# Patient Record
Sex: Male | Born: 1990
Health system: Southern US, Community
[De-identification: ages and names within clinical notes are randomized; demographics above are authoritative.]

## PROBLEM LIST (undated history)

## (undated) DIAGNOSIS — J45909 Unspecified asthma, uncomplicated: Secondary | ICD-10-CM

## (undated) HISTORY — PX: TONSILLECTOMY: SUR1361

---
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
## 2015-09-20 ENCOUNTER — Encounter (HOSPITAL_COMMUNITY): Payer: Self-pay | Admitting: Emergency Medicine

## 2015-09-20 ENCOUNTER — Emergency Department (HOSPITAL_COMMUNITY)
Admission: EM | Admit: 2015-09-20 | Discharge: 2015-09-21 | Disposition: A | Discharge status: Home / Self Care | Payer: BLUE CROSS/BLUE SHIELD | Attending: Emergency Medicine | Admitting: Emergency Medicine

## 2015-09-20 DIAGNOSIS — R05 Cough: Secondary | ICD-10-CM | POA: Insufficient documentation

## 2015-09-20 DIAGNOSIS — Z202 Contact with and (suspected) exposure to infections with a predominantly sexual mode of transmission: Secondary | ICD-10-CM | POA: Diagnosis present

## 2015-09-20 DIAGNOSIS — R197 Diarrhea, unspecified: Secondary | ICD-10-CM | POA: Diagnosis not present

## 2015-09-20 DIAGNOSIS — J029 Acute pharyngitis, unspecified: Secondary | ICD-10-CM | POA: Diagnosis not present

## 2015-09-20 DIAGNOSIS — Z880 Allergy status to penicillin: Secondary | ICD-10-CM | POA: Insufficient documentation

## 2015-09-20 DIAGNOSIS — J45909 Unspecified asthma, uncomplicated: Secondary | ICD-10-CM | POA: Diagnosis not present

## 2015-09-20 DIAGNOSIS — R499 Unspecified voice and resonance disorder: Secondary | ICD-10-CM | POA: Insufficient documentation

## 2015-09-20 DIAGNOSIS — R5383 Other fatigue: Secondary | ICD-10-CM | POA: Insufficient documentation

## 2015-09-20 DIAGNOSIS — B20 Human immunodeficiency virus [HIV] disease: Secondary | ICD-10-CM | POA: Diagnosis not present

## 2015-09-20 HISTORY — DX: Unspecified asthma, uncomplicated: J45.909

## 2015-09-20 NOTE — ED Notes (Signed)
Pt states that he had sexual contact with a person who had HIV in November and was tested but wanted to be retested. States he has felt 'run down' since that time. Alert and oriented.

## 2015-09-20 NOTE — ED Provider Notes (Signed)
History  By signing my name below, I, Karle PlumberJennifer Tensley, attest that this documentation has been prepared under the direction and in the presence of [employer] Maelyn Berrey, PA-C. Electronically Signed: Karle PlumberJennifer Tensley, ED Scribe. 09/21/2015. 12:02 AM.  Chief Complaint  Patient presents with  . Exposure to STD   The history is provided by the patient and medical records. No language interpreter was used.    HPI Comments:  Jesse Smith is a 25 y.o. male who presents to the Emergency Department complaining of an exposure to HIV a few months ago. He states he had unprotected intercourse with a male partner that later was diagnosed with HIV. Pt states he had a full STD panel done two months ago and received a negative result, but wants to follow up with the HIV testing. He reports intermittent cough, scratchy throat and hoarseness with some mild diarrhea. He also reports generally fatigued. He has not taken anything to treat his symptoms. He denies modifying factors. He denies fever, chills, nausea and vomiting.   Past Medical History  Diagnosis Date  . Asthma    Past Surgical History  Procedure Laterality Date  . Tonsillectomy     No family history on file. Social History  Substance Use Topics  . Smoking status: Never Smoker   . Smokeless tobacco: Not on file  . Alcohol Use: No    Review of Systems  Constitutional: Positive for fatigue. Negative for fever and chills.  HENT: Positive for sore throat and voice change.   Respiratory: Positive for cough.   Gastrointestinal: Negative for nausea and vomiting.  All other systems reviewed and are negative.   Allergies  Azithromycin; Ceclor; and Penicillins  Home Medications   Prior to Admission medications   Not on File   Triage Vitals: BP 117/67 mmHg  Pulse 77  Temp(Src) 97.7 F (36.5 C) (Oral)  Resp 16  Ht 5\' 11"  (1.803 m)  Wt 135 lb (61.236 kg)  BMI 18.84 kg/m2  SpO2 100%  Physical Exam  Constitutional: He is oriented to  person, place, and time. He appears well-developed and well-nourished. No distress.  Nontoxic/nonseptic appearing  HENT:  Head: Normocephalic and atraumatic.  Eyes: Conjunctivae and EOM are normal. No scleral icterus.  Neck: Normal range of motion.  No nuchal rigidity or meningismus  Cardiovascular: Normal rate, regular rhythm and intact distal pulses.   Pulmonary/Chest: Effort normal and breath sounds normal. No respiratory distress. He has no wheezes. He has no rales.  Lungs CTAB  Musculoskeletal: Normal range of motion.  Neurological: He is alert and oriented to person, place, and time. He exhibits normal muscle tone. Coordination normal.  Ambulatory with steady gait  Skin: Skin is warm and dry. No rash noted. He is not diaphoretic. No erythema. No pallor.  Psychiatric: He has a normal mood and affect. His behavior is normal.  Nursing note and vitals reviewed.   ED Course  Procedures (including critical care time) DIAGNOSTIC STUDIES: Oxygen Saturation is 100% on RA, normal by my interpretation.   COORDINATION OF CARE: 12:02 AM- Will order rapid HIV test. Pt verbalizes understanding and agrees to plan.  Medications - No data to display  Labs Review Labs Reviewed  RAPID HIV SCREEN (HIV 1/2 AB+AG)     MDM   Final diagnoses:  Other fatigue    25 year old male presents to the Emergency Department requesting an HIV test. He has a history of unprotected sexual intercourse with a partner tested positive for HIV. Patient initially had a negative  HIV test in November. He was told to have a second test completed. This was done in the emergency department and has been found to be negative. Patient stable for discharge. No indication for further emergent workup at this time.  I personally performed the services described in this documentation, which was scribed in my presence. The recorded information has been reviewed and is accurate.    Filed Vitals:   09/20/15 2341  BP: 117/67   Pulse: 77  Temp: 97.7 F (36.5 C)  TempSrc: Oral  Resp: 16  Height: 5\' 11"  (1.803 m)  Weight: 61.236 kg  SpO2: 100%      Antony Madura, PA-C 09/21/15 0117  Rolland Porter, MD 09/24/15 1229

## 2015-09-21 LAB — RAPID HIV SCREEN (HIV 1/2 AB+AG)
HIV 1/2 ANTIBODIES: NONREACTIVE
HIV-1 P24 ANTIGEN - HIV24: NONREACTIVE

## 2015-09-21 NOTE — Discharge Instructions (Signed)
Safe Sex  Safe sex is about reducing the risk of giving or getting a sexually transmitted disease (STD). STDs are spread through sexual contact involving the genitals, mouth, or rectum. Some STDs can be cured and others cannot. Safe sex can also prevent unintended pregnancies.   WHAT ARE SOME SAFE SEX PRACTICES?  · Limit your sexual activity to only one partner who is having sex with only you.  · Talk to your partner about his or her past partners, past STDs, and drug use.  · Use a condom every time you have sexual intercourse. This includes vaginal, oral, and anal sexual activity. Both females and males should wear condoms during oral sex. Only use latex or polyurethane condoms and water-based lubricants. Using petroleum-based lubricants or oils to lubricate a condom will weaken the condom and increase the chance that it will break. The condom should be in place from the beginning to the end of sexual activity. Wearing a condom reduces, but does not completely eliminate, your risk of getting or giving an STD. STDs can be spread by contact with infected body fluids and skin.  · Get vaccinated for hepatitis B and HPV.  · Avoid alcohol and recreational drugs, which can affect your judgment. You may forget to use a condom or participate in high-risk sex.  · For females, avoid douching after sexual intercourse. Douching can spread an infection farther into the reproductive tract.  · Check your body for signs of sores, blisters, rashes, or unusual discharge. See your health care provider if you notice any of these signs.  · Avoid sexual contact if you have symptoms of an infection or are being treated for an STD. If you or your partner has herpes, avoid sexual contact when blisters are present. Use condoms at all other times.  · If you are at risk of being infected with HIV, it is recommended that you take a prescription medicine daily to prevent HIV infection. This is called pre-exposure prophylaxis (PrEP). You are  considered at risk if:    You are a man who has sex with other men (MSM).    You are a heterosexual man or woman who is sexually active with more than one partner.    You take drugs by injection.    You are sexually active with a partner who has HIV.  · Talk with your health care provider about whether you are at high risk of being infected with HIV. If you choose to begin PrEP, you should first be tested for HIV. You should then be tested every 3 months for as long as you are taking PrEP.  · See your health care provider for regular screenings, exams, and tests for other STDs. Before having sex with a new partner, each of you should be screened for STDs and should talk about the results with each other.  WHAT ARE THE BENEFITS OF SAFE SEX?   · There is less chance of getting or giving an STD.  · You can prevent unwanted or unintended pregnancies.  · By discussing safe sex concerns with your partner, you may increase feelings of intimacy, comfort, trust, and honesty between the two of you.     This information is not intended to replace advice given to you by your health care provider. Make sure you discuss any questions you have with your health care provider.     Document Released: 10/11/2004 Document Revised: 09/24/2014 Document Reviewed: 02/25/2012  Elsevier Interactive Patient Education ©2016 Elsevier Inc.

## 2017-02-21 ENCOUNTER — Telehealth: Payer: Self-pay

## 2017-02-21 NOTE — Telephone Encounter (Signed)
Barbara CowerJason with [facility] called the office to see if we are able to schedule this patient.  He stated the patient told him we never called for an appointment.    Barbara CowerJason was informed the patient was called several times and he has no voice mail and has not returned our calls.    I attempted to reach patient today to schedule appointment.   Laurell Josephsammy K Kaiyah Eber, RN

## 2017-03-21 ENCOUNTER — Ambulatory Visit (INDEPENDENT_AMBULATORY_CARE_PROVIDER_SITE_OTHER): Payer: BLUE CROSS/BLUE SHIELD | Admitting: Licensed Clinical Social Worker

## 2017-03-21 ENCOUNTER — Other Ambulatory Visit (HOSPITAL_COMMUNITY)
Admission: RE | Admit: 2017-03-21 | Discharge: 2017-03-21 | Disposition: A | Discharge status: Home / Self Care | Payer: BLUE CROSS/BLUE SHIELD | Source: Ambulatory Visit | Attending: Internal Medicine | Admitting: Internal Medicine

## 2017-03-21 ENCOUNTER — Telehealth: Payer: Self-pay | Admitting: *Deleted

## 2017-03-21 ENCOUNTER — Ambulatory Visit (INDEPENDENT_AMBULATORY_CARE_PROVIDER_SITE_OTHER): Payer: BLUE CROSS/BLUE SHIELD | Admitting: Internal Medicine

## 2017-03-21 ENCOUNTER — Other Ambulatory Visit: Payer: Self-pay | Admitting: Pharmacist

## 2017-03-21 ENCOUNTER — Encounter: Payer: Self-pay | Admitting: Internal Medicine

## 2017-03-21 VITALS — BP 144/72 | Temp 97.6°F | Ht 72.0 in | Wt 142.0 lb

## 2017-03-21 DIAGNOSIS — B20 Human immunodeficiency virus [HIV] disease: Secondary | ICD-10-CM | POA: Insufficient documentation

## 2017-03-21 DIAGNOSIS — Z23 Encounter for immunization: Secondary | ICD-10-CM

## 2017-03-21 DIAGNOSIS — F4321 Adjustment disorder with depressed mood: Secondary | ICD-10-CM

## 2017-03-21 MED ORDER — BICTEGRAVIR-EMTRICITAB-TENOFOV 50-200-25 MG PO TABS: 1.0000 | ORAL_TABLET | Freq: Every day | ORAL | 5 refills | Status: DC

## 2017-03-21 MED FILL — BIKTARVY 50-200-25 MG TABS: 50-200-25 | 30 days supply | Qty: 30 | Fill #0

## 2017-03-21 NOTE — Telephone Encounter (Signed)
Attempted to call patient to inform him that Jesse Smith will meet with him on the day he sees pharmacy. No answer and no voice mail set up. [name]

## 2017-03-21 NOTE — Progress Notes (Signed)
Patient ID: Jesse Smith, male    DOB: Jan 11, 1991, 26 y.o.   MRN: [id number]  Reason for visit: to establish care as a new patient with HIV  HPI:   Patient was first diagnosed in about April by the health deparment for risk factor screening.   Previously tested negative in January 2018 during an ED visit.  No labs have yet been done otherwise.  He had some URI-like symptoms in April but no significant lymphadenopathy, no wieght loss, no diarrhea.  He is adjusting to the diagnosis and has no questions.  He is interested in treatment. No other concerns.  No history of GC, chlamydia or syphilis.    Past Medical History:  Diagnosis Date  . Asthma     Prior to Admission medications   Medication Sig Start Date End Date Taking? Authorizing Provider  bictegravir-emtricitabine-tenofovir AF (BIKTARVY) 50-200-25 MG TABS tablet Take 1 tablet by mouth daily. 03/21/17   Kuppelweiser, Cassie L, RPH    Allergies  Allergen Reactions  . Azithromycin Other (See Comments)    n/a  . Ceclor [Cefaclor] Other (See Comments)    n/a  . Penicillins Other (See Comments)    N/a childhood     Social History  Substance Use Topics  . Smoking status: Never Smoker  . Smokeless tobacco: Never Used  . Alcohol use Yes     Comment: occasional    FMH: he is not aware of any cardiac or renal disease in his family  Review of Systems Constitutional: negative for sweats, fatigue, malaise, anorexia and weight loss Respiratory: negative for cough or sputum Gastrointestinal: negative for nausea and diarrhea Integument/breast: negative for rash Hematologic/lymphatic: negative for lymphadenopathy All other systems reviewed and are negative    CONSTITUTIONAL:in no apparent distress and alert  Vitals:   03/21/17 0911  BP: (!) 144/72  Temp: 97.6 F (36.4 C)   EYES: anicteric HENT: no thrush CARD:Cor RRR RESP:CTA B; normal respiratory effort NW:GNFAOGI:Bowel sounds are normal, liver is not enlarged, spleen is not  enlarged MS:no pedal edema noted SKIN: no rashes NEURO: non-focal   Assessment: new patient here with HIV.  Discussed with patient treatment options and side effects, benefits of treatment, long term outcomes.  I discussed the severity of untreated HIV including higher cancer risk, opportunistic infections, renal failure.  Also discussed needing to use condoms, partner disclosure, necessary vaccines, blood monitoring.  All questions answered.    Plan: 1) labs today to include CD4, viral load, hepatitis A, B and C. 2) discussed treatment options and will start Biktarvy if no concerns on the labs 3) will follow up with PharmD in 2weeks to start Biktarvy and 2 months from now

## 2017-03-21 NOTE — Progress Notes (Unsigned)
HPI: Jesse Smith is a 26 y.o. male who presents to the [facility] as a new HIV patient to see Dr. Luciana Smith.   Allergies: Allergies  Allergen Reactions  . Azithromycin Other (See Comments)    n/a  . Ceclor [Cefaclor] Other (See Comments)    n/a  . Penicillins Other (See Comments)    N/a childhood     Past Medical History: Past Medical History:  Diagnosis Date  . Asthma     Social History: Social History   Social History  . Marital status: Single    Spouse name: N/A  . Number of children: N/A  . Years of education: N/A   Social History Main Topics  . Smoking status: Never Smoker  . Smokeless tobacco: Never Used  . Alcohol use Yes     Comment: occasional  . Drug use: No  . Sexual activity: Not Currently    Partners: Male   Other Topics Concern  . Not on file   Social History Narrative  . No narrative on file    Current Regimen: None  Labs: No results found for: HIV1RNAQUANT, HIV1RNAVL, CD4TABS, HEPBSAB, HEPBSAG, HCVAB  CrCl: CrCl cannot be calculated (Patient's most recent lab result is older than the maximum 21 days allowed.).  Lipids: No results found for: CHOL, TRIG, HDL, CHOLHDL, VLDL, LDLCALC  Assessment: Jesse Smith is here today to see Dr. Luciana Smith and initiate care for his newly diagnosed HIV infection.  He tells me he was diagnosed back in April and anxious to get on medications.  I discussed Biktarvy with him.  I educated him on the importance of taking the medication every day and not missing any doses.  He has [employer] [place], so [facility] were able [facility]. His co-pay was ~$1000 but Jesse Smith activated a co-pay card and now he has $0 co-pay. Dr. Luciana Smith will get labs today and he will f/u with me in ~1 month.   Plans: - Start Biktarvy PO daily - [facility] - F/u with me 8/13 at 9am  Jesse Smith, PharmD, CPP Infectious Diseases Clinical Pharmacist [facility] 03/21/2017, 10:11 AM

## 2017-03-21 NOTE — Progress Notes (Signed)
Integrated Behavioral Health Initial Visit  MRN: [id number] Name: Jesse Smith   Session Start time: 10:12 am Session End time: 10:15 am Total time: 3 mins  Type of Service: Integrated Behavioral Health- Individual/Family Interpretor:No. Interpretor Name and Language: N/A   Warm Hand Off Completed.       SUBJECTIVE: Jesse Smith is a 26 y.o. male accompanied by patient. Patient was referred by Dr. Luciana Axeomer for new diagnosis.  Patient reports the following symptoms/concerns: Patient was newly diagnosed as HIV positive in April.  Patient reported that at first he was depressed for one week, but then "snapped" out of it due to distraction from work.  Patient reported that he has a strong support system and that he has been talking to his friends.  Patient denied receiving current mental health treatment and denied needing mental health services at this time.  [facility] providing business card and stated that if he changes his mind he will call for referral or appointment.   Duration of problem: 2 months; Severity of problem: mild  OBJECTIVE: Mood: Euthymic and Affect: Appropriate Risk of harm to self or others: No plan to harm self or others  Thought process: coherent Thought content: logical   GOALS ADDRESSED: Patient will continue to adjust to new diagnosis, become educated about the disease and medications, and seek healthy adjustment to current life circumstances.  Patient will monitor self for depressive symptoms and seek help as needed.  INTERVENTIONS: Solution-Focused Strategies and Link to [facility]   ASSESSMENT: Patient currently experiencing adjustment issues due to new diagnosis and may benefit from community mental health.  PLAN: 1. Referral(s): [facility] ([facility]/Outside Clinic) 2. Patient was provided with a business card and [facility] if he changes his mind to receive a referral or appointment.  Vergia AlbertsSherry  Raynell Upton, [facility]

## 2017-03-22 LAB — URINALYSIS, ROUTINE W REFLEX MICROSCOPIC
Bilirubin Urine: NEGATIVE
Glucose, UA: NEGATIVE
HGB URINE DIPSTICK: NEGATIVE
Ketones, ur: NEGATIVE
NITRITE: NEGATIVE
PH: 6.5 (ref 5.0–8.0)
Protein, ur: NEGATIVE
Specific Gravity, Urine: 1.017 (ref 1.001–1.035)

## 2017-03-22 LAB — COMPLETE METABOLIC PANEL WITH GFR
ALBUMIN: 4.3 g/dL (ref 3.6–5.1)
ALK PHOS: 83 U/L (ref 40–115)
ALT: 18 U/L (ref 9–46)
AST: 15 U/L (ref 10–40)
BUN: 13 mg/dL (ref 7–25)
CALCIUM: 9.3 mg/dL (ref 8.6–10.3)
CO2: 25 mmol/L (ref 20–31)
CREATININE: 0.91 mg/dL (ref 0.60–1.35)
Chloride: 101 mmol/L (ref 98–110)
GFR, Est African American: >89 mL/min (ref >=60)
GFR, Est Non African American: >89 mL/min (ref >=60)
Glucose, Bld: 84 mg/dL (ref 65–99)
Potassium: 3.9 mmol/L (ref 3.5–5.3)
Sodium: 137 mmol/L (ref 135–146)
Total Bilirubin: 0.4 mg/dL (ref 0.2–1.2)
Total Protein: 8 g/dL (ref 6.1–8.1)

## 2017-03-22 LAB — HEPATITIS C ANTIBODY: HCV Ab: NEGATIVE

## 2017-03-22 LAB — URINALYSIS, MICROSCOPIC ONLY
Bacteria, UA: NONE SEEN [HPF]
CASTS: NONE SEEN [LPF]
Crystals: NONE SEEN [HPF]
RBC / HPF: NONE SEEN RBC/HPF (ref <=2)
YEAST: NONE SEEN [HPF]

## 2017-03-22 LAB — CBC WITH DIFFERENTIAL/PLATELET
Basophils Absolute: 57 cells/uL (ref 0–200)
Basophils Relative: 1 %
EOS PCT: 5 %
Eosinophils Absolute: 285 cells/uL (ref 15–500)
HCT: 43.7 % (ref 38.5–50.0)
Hemoglobin: 14.8 g/dL (ref 13.2–17.1)
LYMPHS PCT: 38 %
Lymphs Abs: 2166 cells/uL (ref 850–3900)
MCH: 30.1 pg (ref 27.0–33.0)
MCHC: 33.9 g/dL (ref 32.0–36.0)
MCV: 88.8 fL (ref 80.0–100.0)
MPV: 10.1 fL (ref 7.5–12.5)
Monocytes Absolute: 627 cells/uL (ref 200–950)
Monocytes Relative: 11 %
NEUTROS PCT: 45 %
Neutro Abs: 2565 cells/uL (ref 1500–7800)
Platelets: 210 10*3/uL (ref 140–400)
RBC: 4.92 MIL/uL (ref 4.20–5.80)
RDW: 14.5 % (ref 11.0–15.0)
WBC: 5.7 10*3/uL (ref 3.8–10.8)

## 2017-03-22 LAB — LIPID PANEL
CHOLESTEROL: 125 mg/dL (ref <200)
HDL: 21 mg/dL — ABNORMAL LOW (ref >40)
LDL CALC: 70 mg/dL (ref <100)
TRIGLYCERIDES: 170 mg/dL — AB (ref <150)
Total CHOL/HDL Ratio: 6 Ratio — ABNORMAL HIGH (ref <5.0)
VLDL: 34 mg/dL — ABNORMAL HIGH (ref <30)

## 2017-03-22 LAB — HEPATITIS B CORE ANTIBODY, TOTAL: Hep B Core Total Ab: REACTIVE — AB

## 2017-03-22 LAB — T-HELPER CELL (CD4) - (RCID CLINIC ONLY)
CD4 % Helper T Cell: 24 % — ABNORMAL LOW (ref 33–55)
CD4 T Cell Abs: 530 /uL (ref 400–2700)

## 2017-03-22 LAB — HEPATITIS B SURFACE ANTIBODY,QUALITATIVE: HEP B S AB: POSITIVE — AB

## 2017-03-22 LAB — URINE CYTOLOGY ANCILLARY ONLY
Chlamydia: NEGATIVE
NEISSERIA GONORRHEA: NEGATIVE

## 2017-03-22 LAB — HEPATITIS A ANTIBODY, TOTAL: Hep A Total Ab: NONREACTIVE

## 2017-03-22 LAB — HEPATITIS B SURFACE ANTIGEN: HEP B S AG: NEGATIVE

## 2017-03-22 LAB — RPR

## 2017-03-23 LAB — QUANTIFERON TB GOLD ASSAY (BLOOD)
Interferon Gamma Release Assay: NEGATIVE
MITOGEN-NIL SO: 7.87 [IU]/mL
QUANTIFERON NIL VALUE: 0.32 [IU]/mL
Quantiferon Tb Ag Minus Nil Value: <0 IU/mL

## 2017-03-26 LAB — HIV-1 RNA,QN PCR W/REFLEX GENOTYPE
HIV-1 RNA, QN PCR: 155000 {copies}/mL — AB
HIV-1 RNA, QN PCR: 5.19 {Log_copies}/mL — AB

## 2017-03-28 LAB — HLA B*5701: HLA-B 5701 W/RFLX HLA-B HIGH: POSITIVE — AB

## 2017-04-05 LAB — HIV-1 GENOTYPR PLUS

## 2017-04-19 ENCOUNTER — Encounter: Payer: Self-pay | Admitting: Licensed Clinical Social Worker

## 2017-04-19 MED FILL — BIKTARVY 50-200-25 MG TABS: 50-200-25 | 30 days supply | Qty: 30 | Fill #1

## 2017-04-29 ENCOUNTER — Ambulatory Visit (INDEPENDENT_AMBULATORY_CARE_PROVIDER_SITE_OTHER): Payer: BLUE CROSS/BLUE SHIELD | Admitting: Pharmacist Clinician (PhC)/ Clinical Pharmacy Specialist

## 2017-04-29 DIAGNOSIS — B20 Human immunodeficiency virus [HIV] disease: Secondary | ICD-10-CM

## 2017-04-29 DIAGNOSIS — Z23 Encounter for immunization: Secondary | ICD-10-CM

## 2017-04-29 LAB — BASIC METABOLIC PANEL
BUN: 11 mg/dL (ref 7–25)
CALCIUM: 9.2 mg/dL (ref 8.6–10.3)
CO2: 24 mmol/L (ref 20–32)
CREATININE: 1.06 mg/dL (ref 0.60–1.35)
Chloride: 103 mmol/L (ref 98–110)
GLUCOSE: 79 mg/dL (ref 65–99)
Potassium: 3.8 mmol/L (ref 3.5–5.3)
Sodium: 139 mmol/L (ref 135–146)

## 2017-04-29 NOTE — Patient Instructions (Signed)
Continue Biktavy 1 daily at about the same time We will do labs today

## 2017-04-29 NOTE — Progress Notes (Signed)
HPI: Jesse Smith is a 26 y.o. male who is here to follow up with pharmacy for labs and adherence.   Allergies: Allergies  Allergen Reactions  . Azithromycin Other (See Comments)    n/a  . Ceclor [Cefaclor] Other (See Comments)    n/a  . Penicillins Other (See Comments)    N/a childhood     Vitals:    Past Medical History: Past Medical History:  Diagnosis Date  . Asthma     Social History: Social History   Social History  . Marital status: Single    Spouse name: N/A  . Number of children: N/A  . Years of education: N/A   Social History Main Topics  . Smoking status: Never Smoker  . Smokeless tobacco: Never Used  . Alcohol use Yes     Comment: occasional  . Drug use: No  . Sexual activity: Not Currently    Partners: Male   Other Topics Concern  . Not on file   Social History Narrative  . No narrative on file    Previous Regimen:   Current Regimen: Biktarvy  Labs: CD4 T Cell Abs (/uL)  Date Value  03/21/2017 530   Hep B S Ab (no units)  Date Value  03/21/2017 POS (A)   Hepatitis B Surface Ag (no units)  Date Value  03/21/2017 NEGATIVE   HCV Ab (no units)  Date Value  03/21/2017 NEGATIVE    CrCl: CrCl cannot be calculated (Patient's most recent lab result is older than the maximum 21 days allowed.).  Lipids:    Component Value Date/Time   CHOL 125 03/21/2017 1746   TRIG 170 (H) 03/21/2017 1746   HDL 21 (L) 03/21/2017 1746   CHOLHDL 6.0 (H) 03/21/2017 1746   VLDL 34 (H) 03/21/2017 1746   LDLCALC 70 03/21/2017 1746    Assessment: Jesse Smith was recently started on his Biktarvy for HIV. He has tolerated very well without any side effects and has not missed any doses. This is the only med that he is currently on.Marland Kitchen. [facility]. He is hep A ab neg so we will start the series today. Scheduled him to f/u with Dr. Luciana Axeomer in Oct. He can get his second Menveo vaccine then. He is current working at both [employer] and [employer]. He  is going to school at [employer] soon for a business type of degree.   Encouraged him to sign up for MyChart so he can see his appts and lab results.   Recommendations:  HIV VL today Cont Biktarvy 1 daily F/u with Dr. Luciana Axeomer in Oct  Hep A#1 today Menveo #2 in Oct  Kunio Cummiskey, PharmD, BCPS, AAHIVP, CPP Clinical Infectious Disease Pharmacist [facility] 04/29/2017, 11:53 AM

## 2017-05-02 LAB — HIV-1 RNA QUANT-NO REFLEX-BLD
HIV 1 RNA QUANT: 44 {copies}/mL — AB
HIV-1 RNA Quant, Log: 1.64 Log copies/mL — ABNORMAL HIGH

## 2017-05-17 MED FILL — BIKTARVY 50-200-25 MG TABS: 50-200-25 | 30 days supply | Qty: 30 | Fill #2

## 2017-06-21 MED FILL — BIKTARVY 50-200-25 MG TABS: 50-200-25 | 30 days supply | Qty: 30 | Fill #3

## 2017-07-02 ENCOUNTER — Encounter: Payer: Self-pay | Admitting: Internal Medicine

## 2017-07-02 ENCOUNTER — Ambulatory Visit (INDEPENDENT_AMBULATORY_CARE_PROVIDER_SITE_OTHER): Payer: BLUE CROSS/BLUE SHIELD | Admitting: Internal Medicine

## 2017-07-02 VITALS — BP 125/76 | HR 60 | Temp 98.0°F | Ht 71.0 in | Wt 155.0 lb

## 2017-07-02 DIAGNOSIS — Z7185 Encounter for immunization safety counseling: Secondary | ICD-10-CM

## 2017-07-02 DIAGNOSIS — Z7189 Other specified counseling: Secondary | ICD-10-CM | POA: Diagnosis not present

## 2017-07-02 DIAGNOSIS — B20 Human immunodeficiency virus [HIV] disease: Secondary | ICD-10-CM

## 2017-07-02 DIAGNOSIS — G4700 Insomnia, unspecified: Secondary | ICD-10-CM

## 2017-07-03 DIAGNOSIS — G4700 Insomnia, unspecified: Secondary | ICD-10-CM | POA: Insufficient documentation

## 2017-07-03 DIAGNOSIS — Z7189 Other specified counseling: Secondary | ICD-10-CM | POA: Insufficient documentation

## 2017-07-03 DIAGNOSIS — Z7185 Encounter for immunization safety counseling: Secondary | ICD-10-CM | POA: Insufficient documentation

## 2017-07-03 LAB — T-HELPER CELL (CD4) - (RCID CLINIC ONLY)
CD4 % Helper T Cell: 26 % — ABNORMAL LOW (ref 33–55)
CD4 T CELL ABS: 630 /uL (ref 400–2700)

## 2017-07-03 NOTE — Progress Notes (Signed)
   Subjective:    Patient ID: Jesse SpeckingJamie C Smith, male    DOB: Nov 01, 1990, 26 y.o.   MRN: [id number]  HPI Here for follow up of HIV Started on Biktarvy and no issues.  Has not had any missed doses.  Working two jobs now and plans to start school in the Spring.  Takes Biktarvy at night but has difficulty sleeping.  No associated n/v/d.  No rashes.     Review of Systems  Constitutional: Positive for fatigue.  Skin: Negative for rash.  Neurological: Negative for dizziness.  Psychiatric/Behavioral: Positive for sleep disturbance.       Objective:   Physical Exam  Constitutional: He appears well-developed and well-nourished. No distress.  Eyes: No scleral icterus.  Cardiovascular: Normal rate, regular rhythm and normal heart sounds.   No murmur heard. Pulmonary/Chest: Effort normal. No respiratory distress.  Lymphadenopathy:    He has no cervical adenopathy.  Skin: No rash noted.   SH: no tobacco       Assessment & Plan:

## 2017-07-03 NOTE — Assessment & Plan Note (Signed)
He will try taking the medication in the am and see if his sleeping is improved.  Discussed sleep hygiiene otherwise

## 2017-07-03 NOTE — Assessment & Plan Note (Signed)
Doing well and if labs ok, rtc 3-4 months

## 2017-07-03 NOTE — Assessment & Plan Note (Signed)
Offered flu shot, refused.  Counseled on its importance

## 2017-07-05 LAB — HIV-1 RNA QUANT-NO REFLEX-BLD
HIV 1 RNA QUANT: 43 {copies}/mL — AB
HIV-1 RNA QUANT, LOG: 1.63 {Log_copies}/mL — AB

## 2017-07-23 MED FILL — BIKTARVY 50-200-25 MG TABS: 50-200-25 | 30 days supply | Qty: 30 | Fill #4

## 2017-08-21 MED FILL — BIKTARVY 50-200-25 MG TABS: 50-200-25 | 30 days supply | Qty: 30 | Fill #5

## 2017-09-04 ENCOUNTER — Other Ambulatory Visit: Payer: Self-pay | Admitting: Pharmacist Clinician (PhC)/ Clinical Pharmacy Specialist

## 2017-09-04 DIAGNOSIS — B20 Human immunodeficiency virus [HIV] disease: Secondary | ICD-10-CM

## 2017-09-04 MED ORDER — BICTEGRAVIR-EMTRICITAB-TENOFOV 50-200-25 MG PO TABS: 1.0000 | ORAL_TABLET | Freq: Every day | ORAL | 5 refills | Status: DC

## 2017-09-04 NOTE — Progress Notes (Signed)
Sending refills.

## 2017-09-20 MED FILL — BIKTARVY 50-200-25 MG TABS: 50-200-25 | 30 days supply | Qty: 30 | Fill #0

## 2017-10-01 ENCOUNTER — Ambulatory Visit (INDEPENDENT_AMBULATORY_CARE_PROVIDER_SITE_OTHER): Payer: BLUE CROSS/BLUE SHIELD | Admitting: Internal Medicine

## 2017-10-01 ENCOUNTER — Other Ambulatory Visit: Payer: Self-pay

## 2017-10-01 ENCOUNTER — Encounter: Payer: Self-pay | Admitting: Internal Medicine

## 2017-10-01 VITALS — BP 121/69 | HR 65 | Temp 98.2°F | Ht 71.0 in | Wt 164.0 lb

## 2017-10-01 DIAGNOSIS — Z7185 Encounter for immunization safety counseling: Secondary | ICD-10-CM

## 2017-10-01 DIAGNOSIS — Z7189 Other specified counseling: Secondary | ICD-10-CM

## 2017-10-01 DIAGNOSIS — B20 Human immunodeficiency virus [HIV] disease: Secondary | ICD-10-CM | POA: Diagnosis not present

## 2017-10-01 NOTE — Progress Notes (Signed)
   Subjective:    Patient ID: Jesse Smith, male    DOB: Jun 29, 1991, 27 y.o.   MRN: [id number]  HPI Here for follow up of HIV Started on Biktarvy and no issues.  Has not had any missed doses.  Takes Biktarvy at night now in the am.  Improved sleep.  No associated n/v/d.  No rashes.     Review of Systems  Constitutional: Positive for fatigue.       Fatigue from working a lot  Skin: Negative for rash.  Neurological: Negative for dizziness.  Psychiatric/Behavioral: Positive for sleep disturbance.       Improved       Objective:   Physical Exam  Constitutional: He appears well-developed and well-nourished. No distress.  Eyes: No scleral icterus.  Cardiovascular: Normal rate, regular rhythm and normal heart sounds.  No murmur heard. Pulmonary/Chest: Effort normal. No respiratory distress.  Lymphadenopathy:    He has no cervical adenopathy.  Skin: No rash noted.   SH: no tobacco       Assessment & Plan:

## 2017-10-01 NOTE — Assessment & Plan Note (Signed)
Counseled on the flu shot, refused.   

## 2017-10-01 NOTE — Assessment & Plan Note (Signed)
Doing well, continue biktarvy and rtc 6 months

## 2017-10-02 LAB — T-HELPER CELL (CD4) - (RCID CLINIC ONLY)
CD4 T CELL ABS: 610 /uL (ref 400–2700)
CD4 T CELL HELPER: 26 % — AB (ref 33–55)

## 2017-10-03 LAB — HIV-1 RNA QUANT-NO REFLEX-BLD
HIV 1 RNA QUANT: NOT DETECTED {copies}/mL
HIV-1 RNA QUANT, LOG: NOT DETECTED {Log_copies}/mL

## 2017-10-18 MED FILL — BIKTARVY 50-200-25 MG TABS: 50-200-25 | 30 days supply | Qty: 30 | Fill #1

## 2017-11-13 MED FILL — BIKTARVY 50-200-25 MG TABS: 50-200-25 | 30 days supply | Qty: 30 | Fill #2

## 2017-12-13 MED FILL — BIKTARVY 50-200-25 MG TABS: 50-200-25 | 30 days supply | Qty: 30 | Fill #3

## 2018-01-13 MED FILL — BIKTARVY 50-200-25 MG TABS: 50-200-25 | 30 days supply | Qty: 30 | Fill #4

## 2018-02-17 MED FILL — BIKTARVY 50-200-25 MG TABS: 50-200-25 | 30 days supply | Qty: 30 | Fill #5

## 2018-03-11 ENCOUNTER — Other Ambulatory Visit: Payer: Self-pay | Admitting: Internal Medicine

## 2018-03-11 DIAGNOSIS — B20 Human immunodeficiency virus [HIV] disease: Secondary | ICD-10-CM

## 2018-03-14 MED FILL — BIKTARVY 50-200-25 MG TABS: 50-200-25 | 30 days supply | Qty: 30 | Fill #0

## 2018-03-17 ENCOUNTER — Other Ambulatory Visit: Payer: BLUE CROSS/BLUE SHIELD

## 2018-03-17 ENCOUNTER — Other Ambulatory Visit: Payer: Self-pay | Admitting: *Deleted

## 2018-03-17 DIAGNOSIS — Z113 Encounter for screening for infections with a predominantly sexual mode of transmission: Secondary | ICD-10-CM

## 2018-03-17 DIAGNOSIS — B20 Human immunodeficiency virus [HIV] disease: Secondary | ICD-10-CM

## 2018-03-31 ENCOUNTER — Encounter: Payer: Self-pay | Admitting: Internal Medicine

## 2018-03-31 ENCOUNTER — Ambulatory Visit (INDEPENDENT_AMBULATORY_CARE_PROVIDER_SITE_OTHER): Payer: BLUE CROSS/BLUE SHIELD | Admitting: Internal Medicine

## 2018-03-31 VITALS — BP 118/72 | HR 61 | Temp 97.8°F | Ht 71.0 in | Wt 160.0 lb

## 2018-03-31 DIAGNOSIS — Z23 Encounter for immunization: Secondary | ICD-10-CM | POA: Insufficient documentation

## 2018-03-31 DIAGNOSIS — Z7189 Other specified counseling: Secondary | ICD-10-CM | POA: Insufficient documentation

## 2018-03-31 DIAGNOSIS — Z7185 Encounter for immunization safety counseling: Secondary | ICD-10-CM | POA: Insufficient documentation

## 2018-03-31 DIAGNOSIS — Z79899 Other long term (current) drug therapy: Secondary | ICD-10-CM

## 2018-03-31 DIAGNOSIS — Z113 Encounter for screening for infections with a predominantly sexual mode of transmission: Secondary | ICD-10-CM | POA: Insufficient documentation

## 2018-03-31 DIAGNOSIS — B20 Human immunodeficiency virus [HIV] disease: Secondary | ICD-10-CM

## 2018-03-31 NOTE — Progress Notes (Signed)
   Subjective:    Patient ID: Jesse SpeckingJamie C Smith, male    DOB: [birth year], 27 y.o.   MRN: [id number]  HPI Here for follow up of HIV Started on Biktarvy and no issues.  Has not had any missed doses.  No associated n/v/d.  No new issues. No rashes.     Review of Systems  Constitutional: Negative for fatigue.       Fatigue from working a lot  Skin: Negative for rash.  Neurological: Negative for dizziness.  Psychiatric/Behavioral: Negative for sleep disturbance.       Improved       Objective:   Physical Exam  Constitutional: He appears well-developed and well-nourished. No distress.  Eyes: No scleral icterus.  Cardiovascular: Normal rate, regular rhythm and normal heart sounds.  No murmur heard. Pulmonary/Chest: Effort normal. No respiratory distress.  Lymphadenopathy:    He has no cervical adenopathy.  Skin: No rash noted.   SH: no tobacco       Assessment & Plan:

## 2018-03-31 NOTE — Assessment & Plan Note (Signed)
Counseled on Prevnar and given today 

## 2018-03-31 NOTE — Assessment & Plan Note (Signed)
Will screen.  Reports good condom use.

## 2018-03-31 NOTE — Assessment & Plan Note (Signed)
Counseled on vaccine and series started today Will return in 2 months for #2 then #3 next visit.

## 2018-03-31 NOTE — Assessment & Plan Note (Signed)
Doing well on Biktarvy and no changes.  rtc 6 months.

## 2018-04-01 LAB — COMPLETE METABOLIC PANEL WITH GFR
AG Ratio: 1.6 (calc) (ref 1.0–2.5)
ALKALINE PHOSPHATASE (APISO): 70 U/L (ref 40–115)
ALT: 10 U/L (ref 9–46)
AST: 14 U/L (ref 10–40)
Albumin: 4.7 g/dL (ref 3.6–5.1)
BILIRUBIN TOTAL: 0.5 mg/dL (ref 0.2–1.2)
BUN: 12 mg/dL (ref 7–25)
CHLORIDE: 99 mmol/L (ref 98–110)
CO2: 29 mmol/L (ref 20–32)
CREATININE: 1.03 mg/dL (ref 0.60–1.35)
Calcium: 9.4 mg/dL (ref 8.6–10.3)
GFR, Est African American: 116 mL/min/{1.73_m2} (ref >=60)
GFR, Est Non African American: 100 mL/min/{1.73_m2} (ref >=60)
GLOBULIN: 3 g/dL (ref 1.9–3.7)
GLUCOSE: 91 mg/dL (ref 65–99)
Potassium: 3.8 mmol/L (ref 3.5–5.3)
SODIUM: 136 mmol/L (ref 135–146)
Total Protein: 7.7 g/dL (ref 6.1–8.1)

## 2018-04-01 LAB — CBC WITH DIFFERENTIAL/PLATELET
BASOS ABS: 20 {cells}/uL (ref 0–200)
Basophils Relative: 0.4 %
EOS ABS: 100 {cells}/uL (ref 15–500)
Eosinophils Relative: 2 %
HEMATOCRIT: 44.7 % (ref 38.5–50.0)
HEMOGLOBIN: 15.6 g/dL (ref 13.2–17.1)
LYMPHS ABS: 2000 {cells}/uL (ref 850–3900)
MCH: 32.1 pg (ref 27.0–33.0)
MCHC: 34.9 g/dL (ref 32.0–36.0)
MCV: 92 fL (ref 80.0–100.0)
MPV: 10.3 fL (ref 7.5–12.5)
Monocytes Relative: 10.5 %
NEUTROS ABS: 2355 {cells}/uL (ref 1500–7800)
Neutrophils Relative %: 47.1 %
Platelets: 237 10*3/uL (ref 140–400)
RBC: 4.86 10*6/uL (ref 4.20–5.80)
RDW: 12 % (ref 11.0–15.0)
Total Lymphocyte: 40 %
WBC: 5 10*3/uL (ref 3.8–10.8)
WBCMIX: 525 {cells}/uL (ref 200–950)

## 2018-04-01 LAB — LIPID PANEL
CHOL/HDL RATIO: 5.6 (calc) — AB (ref <5.0)
Cholesterol: 169 mg/dL (ref <200)
HDL: 30 mg/dL — AB (ref >40)
LDL CHOLESTEROL (CALC): 104 mg/dL — AB
NON-HDL CHOLESTEROL (CALC): 139 mg/dL — AB (ref <130)
Triglycerides: 231 mg/dL — ABNORMAL HIGH (ref <150)

## 2018-04-01 LAB — RPR: RPR Ser Ql: NONREACTIVE

## 2018-04-01 LAB — T-HELPER CELL (CD4) - (RCID CLINIC ONLY)
CD4 % Helper T Cell: 34 % (ref 33–55)
CD4 T CELL ABS: 660 /uL (ref 400–2700)

## 2018-04-02 LAB — HIV-1 RNA QUANT-NO REFLEX-BLD
HIV 1 RNA QUANT: DETECTED {copies}/mL — AB
HIV-1 RNA Quant, Log: <1.3 Log copies/mL — AB

## 2018-04-04 ENCOUNTER — Other Ambulatory Visit: Payer: Self-pay | Admitting: Internal Medicine

## 2018-04-04 DIAGNOSIS — B20 Human immunodeficiency virus [HIV] disease: Secondary | ICD-10-CM

## 2018-04-08 MED FILL — BIKTARVY 50-200-25 MG TABS: 50-200-25 | 30 days supply | Qty: 30 | Fill #0

## 2018-05-06 MED FILL — BIKTARVY 50-200-25 MG TABS: 50-200-25 | 30 days supply | Qty: 30 | Fill #1

## 2018-05-14 ENCOUNTER — Emergency Department (HOSPITAL_COMMUNITY)
Admission: EM | Admit: 2018-05-14 | Discharge: 2018-05-14 | Disposition: A | Discharge status: Home / Self Care | Payer: BLUE CROSS/BLUE SHIELD | Attending: Emergency Medicine | Admitting: Emergency Medicine

## 2018-05-14 ENCOUNTER — Emergency Department (HOSPITAL_COMMUNITY): Payer: BLUE CROSS/BLUE SHIELD

## 2018-05-14 ENCOUNTER — Encounter (HOSPITAL_COMMUNITY): Payer: Self-pay

## 2018-05-14 DIAGNOSIS — Z79899 Other long term (current) drug therapy: Secondary | ICD-10-CM | POA: Diagnosis not present

## 2018-05-14 DIAGNOSIS — S299XXA Unspecified injury of thorax, initial encounter: Secondary | ICD-10-CM | POA: Diagnosis present

## 2018-05-14 DIAGNOSIS — S4992XA Unspecified injury of left shoulder and upper arm, initial encounter: Secondary | ICD-10-CM | POA: Diagnosis not present

## 2018-05-14 DIAGNOSIS — Y92410 Unspecified street and highway as the place of occurrence of the external cause: Secondary | ICD-10-CM | POA: Insufficient documentation

## 2018-05-14 DIAGNOSIS — Y998 Other external cause status: Secondary | ICD-10-CM | POA: Insufficient documentation

## 2018-05-14 DIAGNOSIS — Y9389 Activity, other specified: Secondary | ICD-10-CM | POA: Diagnosis not present

## 2018-05-14 DIAGNOSIS — S29012A Strain of muscle and tendon of back wall of thorax, initial encounter: Secondary | ICD-10-CM | POA: Diagnosis not present

## 2018-05-14 DIAGNOSIS — J45909 Unspecified asthma, uncomplicated: Secondary | ICD-10-CM | POA: Diagnosis not present

## 2018-05-14 MED ORDER — CYCLOBENZAPRINE HCL 10 MG PO TABS: 10.0000 mg | ORAL_TABLET | Freq: Three times a day (TID) | ORAL | 0 refills | Status: DC | PRN

## 2018-05-14 MED ORDER — NAPROXEN 500 MG PO TABS: 500.0000 mg | ORAL_TABLET | Freq: Once | ORAL | Status: DC

## 2018-05-14 MED ORDER — CYCLOBENZAPRINE HCL 10 MG PO TABS
10.0000 mg | ORAL_TABLET | Freq: Once | ORAL | Status: AC
  Administered 2018-05-14: 10 mg via ORAL
  Filled 2018-05-14: qty 1

## 2018-05-14 MED FILL — CYCLOBENZAPRINE HCL 10 MG T: 10 | 7 days supply | Qty: 20 | Fill #0
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
## 2018-05-25 ENCOUNTER — Emergency Department (HOSPITAL_COMMUNITY)
Admission: EM | Admit: 2018-05-25 | Discharge: 2018-05-26 | Disposition: A | Discharge status: Home / Self Care | Payer: BLUE CROSS/BLUE SHIELD | Attending: Emergency Medicine | Admitting: Emergency Medicine

## 2018-05-25 ENCOUNTER — Encounter (HOSPITAL_COMMUNITY): Payer: Self-pay

## 2018-05-25 ENCOUNTER — Other Ambulatory Visit: Payer: Self-pay

## 2018-05-25 DIAGNOSIS — Z79899 Other long term (current) drug therapy: Secondary | ICD-10-CM | POA: Diagnosis not present

## 2018-05-25 DIAGNOSIS — B20 Human immunodeficiency virus [HIV] disease: Secondary | ICD-10-CM | POA: Insufficient documentation

## 2018-05-25 DIAGNOSIS — L02415 Cutaneous abscess of right lower limb: Secondary | ICD-10-CM | POA: Diagnosis not present

## 2018-05-25 NOTE — ED Triage Notes (Signed)
Pt reports an abscess to his R upper anterior thigh. He states that he tried to "pop" it and some purulent drainage came out, but it has gotten bigger, more painful and warm.

## 2018-05-26 MED ORDER — LIDOCAINE-EPINEPHRINE (PF) 2 %-1:200000 IJ SOLN
10.0000 mL | Freq: Once | INTRAMUSCULAR | Status: AC
  Administered 2018-05-26: 10 mL
  Filled 2018-05-26: qty 20

## 2018-05-26 MED ORDER — SULFAMETHOXAZOLE-TRIMETHOPRIM 800-160 MG PO TABS: 1.0000 | ORAL_TABLET | Freq: Two times a day (BID) | ORAL | 0 refills | Status: AC

## 2018-05-26 MED ORDER — SULFAMETHOXAZOLE-TRIMETHOPRIM 800-160 MG PO TABS
1.0000 | ORAL_TABLET | Freq: Once | ORAL | Status: AC
  Administered 2018-05-26: 1 via ORAL
  Filled 2018-05-26: qty 1

## 2018-05-26 MED ORDER — NAPROXEN 500 MG PO TABS
500.0000 mg | ORAL_TABLET | Freq: Once | ORAL | Status: AC
  Administered 2018-05-26: 500 mg via ORAL
  Filled 2018-05-26: qty 1

## 2018-05-26 NOTE — ED Provider Notes (Signed)
[facility]-EMERGENCY DEPT Provider Note   CSN: [id number] Arrival date & time: 05/25/18  2151     History   Chief Complaint Chief Complaint  Patient presents with  . Abscess    HPI Jesse Smith is a 27 y.o. male.  27 year old male with a history of asthma presents to the emergency department for evaluation of abscess to his right proximal thigh.  States that he noticed this 4 days ago.  Tried to apply warm compress on one occasion without improvement.  Also tried to "pop" the area.  Had small amount of purulent drainage, but no relief to pain or swelling.  States the area has gotten larger and more painful.  Is warm to touch.  No fevers.  The history is provided by the patient. No language interpreter was used.  Abscess    Past Medical History:  Diagnosis Date  . Asthma     Patient Active Problem List   Diagnosis Date Noted  . Screening examination for venereal disease 03/31/2018  . Encounter for long-term (current) use of high-risk medication 03/31/2018  . Need for prophylactic vaccination against Streptococcus pneumoniae (pneumococcus) 03/31/2018  . HPV vaccine counseling 03/31/2018  . Vaccine counseling 07/03/2017  . Insomnia 07/03/2017  . HIV disease (HCC) 03/21/2017    Past Surgical History:  Procedure Laterality Date  . TONSILLECTOMY          Home Medications    Prior to Admission medications   Medication Sig Start Date End Date Taking? Authorizing Provider  BIKTARVY 50-200-25 MG TABS tablet TAKE 1 TABLET BY MOUTH DAILY. 04/04/18   Comer, Belia Heman, MD  cyclobenzaprine (FLEXERIL) 10 MG tablet Take 1 tablet (10 mg total) by mouth 3 (three) times daily as needed for muscle spasms. 05/14/18   Molpus, John, MD  sulfamethoxazole-trimethoprim (BACTRIM DS,SEPTRA DS) 800-160 MG tablet Take 1 tablet by mouth 2 (two) times daily for 5 days. 05/26/18 05/31/18  Antony Madura, PA-C    Family History History reviewed. No pertinent family  history.  Social History Social History   Tobacco Use  . Smoking status: Never Smoker  . Smokeless tobacco: Never Used  Substance Use Topics  . Alcohol use: Yes    Comment: occasional  . Drug use: No     Allergies   Erythromycin; Azithromycin; Ceclor [cefaclor]; and Penicillins   Review of Systems Review of Systems Ten systems reviewed and are negative for acute change, except as noted in the HPI.    Physical Exam Updated Vital Signs BP 127/65   Pulse 65   Temp 98.5 F (36.9 C) (Oral)   Resp 18   SpO2 100%   Physical Exam  Constitutional: He is oriented to person, place, and time. He appears well-developed and well-nourished. No distress.  Nontoxic appearing and in NAD  HENT:  Head: Normocephalic and atraumatic.  Eyes: Conjunctivae and EOM are normal. No scleral icterus.  Neck: Normal range of motion.  Pulmonary/Chest: Effort normal. No respiratory distress.  Respirations even and unlabored  Musculoskeletal: Normal range of motion.       Right hip: He exhibits tenderness and swelling. He exhibits normal range of motion.       Legs: No decreased AROM or PROM of the R hip.  Neurological: He is alert and oriented to person, place, and time. He exhibits normal muscle tone. Coordination normal.  Skin: Skin is warm and dry. No rash noted. He is not diaphoretic. No erythema. No pallor.  Psychiatric: He has a normal  mood and affect. His behavior is normal.  Nursing note and vitals reviewed.    ED Treatments / Results  Labs (all labs ordered are listed, but only abnormal results are displayed) Labs Reviewed - No data to display  EKG None  Radiology No results found.  Procedures Procedures (including critical care time)   INCISION AND DRAINAGE Performed by: Antony Madura Consent: Verbal consent obtained. Risks and benefits: risks, benefits and alternatives were discussed Type: abscess  Body area: R thigh  Anesthesia: local infiltration  Incision was  made with a scalpel.  Local anesthetic: lidocaine 1% with epinephrine  Anesthetic total: 3 ml  Complexity: complex Blunt dissection to break up loculations  Drainage: purulent  Drainage amount: moderate  Packing material: 1/4 in iodoform gauze  Patient tolerance: Patient tolerated the procedure well with no immediate complications.     Medications Ordered in ED Medications  sulfamethoxazole-trimethoprim (BACTRIM DS,SEPTRA DS) 800-160 MG per tablet 1 tablet (has no administration in time range)  lidocaine-EPINEPHrine (XYLOCAINE W/EPI) 2 %-1:200000 (PF) injection 10 mL (10 mLs Other Given by Other 05/26/18 0057)  naproxen (NAPROSYN) tablet 500 mg (500 mg Oral Given 05/26/18 0053)     Initial Impression / Assessment and Plan / ED Course  I have reviewed the triage vital signs and the nursing notes.  Pertinent labs & imaging results that were available during my care of the patient were reviewed by me and considered in my medical decision making (see chart for details).     Patient with skin abscess amenable to incision and drainage.  Abscess packed with 1/4" iodoform; wound recheck in 2 days advised.  Encouraged home warm soaks and flushing.  Mild signs of cellulitis is surrounding skin.  Will d/c to home with Bactrim.  Return precautions discussed and provided. Patient discharged in stable condition with no unaddressed concerns.   Final Clinical Impressions(s) / ED Diagnoses   Final diagnoses:  Abscess of right thigh    ED Discharge Orders         Ordered    sulfamethoxazole-trimethoprim (BACTRIM DS,SEPTRA DS) 800-160 MG tablet  2 times daily     05/26/18 0242           Antony Madura, PA-C 05/26/18 0244    Devoria Albe, MD 05/26/18 [phone]

## 2018-05-26 NOTE — Discharge Instructions (Addendum)
Take Bactrim as prescribed until finished.  Continue with topical warm compresses 3-4 times per day to promote drainage.  Return for wound recheck in 48 hours as well as for packing removal.  You may take ibuprofen for management of any discomfort.  Return sooner for any new or concerning symptoms.

## 2018-05-27 ENCOUNTER — Other Ambulatory Visit: Payer: Self-pay

## 2018-05-27 ENCOUNTER — Encounter (HOSPITAL_COMMUNITY): Payer: Self-pay | Admitting: *Deleted

## 2018-05-27 DIAGNOSIS — B20 Human immunodeficiency virus [HIV] disease: Secondary | ICD-10-CM | POA: Insufficient documentation

## 2018-05-27 DIAGNOSIS — Z5189 Encounter for other specified aftercare: Secondary | ICD-10-CM | POA: Diagnosis not present

## 2018-05-27 DIAGNOSIS — J45909 Unspecified asthma, uncomplicated: Secondary | ICD-10-CM | POA: Insufficient documentation

## 2018-05-27 DIAGNOSIS — L0291 Cutaneous abscess, unspecified: Secondary | ICD-10-CM | POA: Diagnosis not present

## 2018-05-27 DIAGNOSIS — Z4800 Encounter for change or removal of nonsurgical wound dressing: Secondary | ICD-10-CM | POA: Diagnosis present

## 2018-05-27 NOTE — ED Triage Notes (Signed)
Pt stated "I was seen here for an abscess & told to come back in 48 hours for a recheck."

## 2018-05-28 ENCOUNTER — Emergency Department (HOSPITAL_COMMUNITY)
Admission: EM | Admit: 2018-05-28 | Discharge: 2018-05-28 | Disposition: A | Discharge status: Home / Self Care | Payer: BLUE CROSS/BLUE SHIELD | Attending: Emergency Medicine | Admitting: Emergency Medicine

## 2018-05-28 DIAGNOSIS — Z5189 Encounter for other specified aftercare: Secondary | ICD-10-CM

## 2018-05-28 NOTE — Discharge Instructions (Addendum)
Continue the antibiotics until gone. You will only need to return to the ED if symptoms worsen - if the area starts to grow in size, pain becomes worse, there is spreading redness or if you develop a fever.

## 2018-05-28 NOTE — ED Provider Notes (Signed)
[facility]-EMERGENCY DEPT Provider Note   CSN: [id number] Arrival date & time: 05/27/18  2339     History   Chief Complaint Chief Complaint  Patient presents with  . Wound Check    abscess to right lateral thigh    HPI Jesse Smith is a 27 y.o. male.  Patient here for wound check and packing removal from an abscess that was treated 2 days ago in the ED. He reports he feels the area is less red. There has been no fever. He has not changed the bandage since the I&D on 05/26/18. He reports he is taking the antibiotic as prescribed.   The history is provided by the patient. No language interpreter was used.  Wound Check     Past Medical History:  Diagnosis Date  . Asthma     Patient Active Problem List   Diagnosis Date Noted  . Screening examination for venereal disease 03/31/2018  . Encounter for long-term (current) use of high-risk medication 03/31/2018  . Need for prophylactic vaccination against Streptococcus pneumoniae (pneumococcus) 03/31/2018  . HPV vaccine counseling 03/31/2018  . Vaccine counseling 07/03/2017  . Insomnia 07/03/2017  . HIV disease (HCC) 03/21/2017    Past Surgical History:  Procedure Laterality Date  . TONSILLECTOMY          Home Medications    Prior to Admission medications   Medication Sig Start Date End Date Taking? Authorizing Provider  BIKTARVY 50-200-25 MG TABS tablet TAKE 1 TABLET BY MOUTH DAILY. 04/04/18   Comer, Belia Heman, MD  cyclobenzaprine (FLEXERIL) 10 MG tablet Take 1 tablet (10 mg total) by mouth 3 (three) times daily as needed for muscle spasms. 05/14/18   Molpus, John, MD  sulfamethoxazole-trimethoprim (BACTRIM DS,SEPTRA DS) 800-160 MG tablet Take 1 tablet by mouth 2 (two) times daily for 5 days. 05/26/18 05/31/18  Antony Madura, PA-C    Family History No family history on file.  Social History Social History   Tobacco Use  . Smoking status: Never Smoker  . Smokeless tobacco: Never Used  Substance  Use Topics  . Alcohol use: Yes    Comment: occasional  . Drug use: No     Allergies   Erythromycin; Azithromycin; Ceclor [cefaclor]; and Penicillins   Review of Systems Review of Systems  Constitutional: Negative for fever.  Gastrointestinal: Negative for nausea.  Skin: Positive for wound.  Neurological: Negative for numbness.     Physical Exam Updated Vital Signs BP (!) 123/55 (BP Location: Right Arm)   Pulse 81   Temp 98.4 F (36.9 C) (Oral)   Resp 16   Ht 5\' 11"  (1.803 m)   Wt 72.6 kg   SpO2 100%   BMI 22.32 kg/m   Physical Exam  Constitutional: He is oriented to person, place, and time. He appears well-developed and well-nourished. No distress.  Neck: Normal range of motion.  Pulmonary/Chest: Effort normal.  Musculoskeletal: Normal range of motion.  Neurological: He is alert and oriented to person, place, and time.  Skin: Skin is warm and dry.  peviously I&D'd abscess to proximal anterior right thigh with packing in place. Mild redness surround wound. No active drainage.   Psychiatric: He has a normal mood and affect.  Nursing note and vitals reviewed.    ED Treatments / Results  Labs (all labs ordered are listed, but only abnormal results are displayed) Labs Reviewed - No data to display  EKG None  Radiology No results found.  Procedures Procedures (including critical care  time)  Medications Ordered in ED Medications - No data to display   Initial Impression / Assessment and Plan / ED Course  I have reviewed the triage vital signs and the nursing notes.  Pertinent labs & imaging results that were available during my care of the patient were reviewed by me and considered in my medical decision making (see chart for details).     Patient here for 2 day recheck and packing removal from abscess treated by I&D on 05/26/18. No new complaints.   Packing removed without difficulty. Bandage replaced. Patient can be discharged home. Encouraged to  complete antibiotic regimen.   Final Clinical Impressions(s) / ED Diagnoses   Final diagnoses:  None   1. Abscess recheck  ED Discharge Orders    None       Elpidio Anis, PA-C 05/28/18 0136    Ward, Layla Maw, DO 05/28/18 [phone]

## 2018-05-28 NOTE — ED Notes (Signed)
Provider at bedside

## 2018-05-28 NOTE — ED Notes (Signed)
Using clean technique, applied a telfa dressing over wound and secured with surgical tape. Patient tolerated it well.

## 2018-06-02 ENCOUNTER — Ambulatory Visit: Payer: BLUE CROSS/BLUE SHIELD

## 2018-06-02 MED FILL — BIKTARVY 50-200-25 MG TABS: 50-200-25 | 30 days supply | Qty: 30 | Fill #2

## 2018-06-08 ENCOUNTER — Emergency Department (HOSPITAL_COMMUNITY)
Admission: EM | Admit: 2018-06-08 | Discharge: 2018-06-09 | Discharge status: Against Medical Advice | Payer: BLUE CROSS/BLUE SHIELD

## 2018-06-08 NOTE — ED Notes (Signed)
Pt called for triage no response from lobby 

## 2018-07-07 MED FILL — BIKTARVY 50-200-25 MG TABS: 50-200-25 | 30 days supply | Qty: 30 | Fill #3

## 2018-08-15 MED FILL — BIKTARVY 50-200-25 MG TABS: 50-200-25 | 30 days supply | Qty: 30 | Fill #4

## 2018-09-12 MED FILL — BIKTARVY 50-200-25 MG TABS: 50-200-25 | 30 days supply | Qty: 30 | Fill #5

## 2018-10-01 ENCOUNTER — Ambulatory Visit (INDEPENDENT_AMBULATORY_CARE_PROVIDER_SITE_OTHER): Payer: BLUE CROSS/BLUE SHIELD | Admitting: Internal Medicine

## 2018-10-01 ENCOUNTER — Encounter: Payer: Self-pay | Admitting: Internal Medicine

## 2018-10-01 VITALS — BP 124/75 | HR 69 | Temp 98.0°F | Wt 165.0 lb

## 2018-10-01 DIAGNOSIS — B20 Human immunodeficiency virus [HIV] disease: Secondary | ICD-10-CM

## 2018-10-01 DIAGNOSIS — Z7189 Other specified counseling: Secondary | ICD-10-CM | POA: Diagnosis not present

## 2018-10-01 DIAGNOSIS — Z23 Encounter for immunization: Secondary | ICD-10-CM | POA: Diagnosis not present

## 2018-10-01 DIAGNOSIS — Z7185 Encounter for immunization safety counseling: Secondary | ICD-10-CM

## 2018-10-01 NOTE — Addendum Note (Signed)
Addended by: Gerarda Fraction on: 10/01/2018 04:22 PM   Modules accepted: Orders

## 2018-10-01 NOTE — Assessment & Plan Note (Signed)
Doing well, labs today and rtc 5 months

## 2018-10-01 NOTE — Assessment & Plan Note (Signed)
Discussed and will do #2 today, #3 next visit.

## 2018-10-01 NOTE — Progress Notes (Signed)
   Subjective:    Patient ID: Jesse Smith, male    DOB: 05-Jan-1991, 28 y.o.   MRN: [id number]  HPI Here for follow up of HIV Continues on Biktarvy and no issues.  Has not had any missed doses.  No associated n/v/d.  No new issues. No rashes.  May be moving to [place] after his next visit.    Review of Systems  Constitutional: Negative for fatigue.  Skin: Negative for rash.  Neurological: Negative for dizziness.  Psychiatric/Behavioral: Negative for sleep disturbance.       Objective:   Physical Exam  Constitutional: He appears well-developed and well-nourished. No distress.  Eyes: No scleral icterus.  Cardiovascular: Normal rate, regular rhythm and normal heart sounds.  No murmur heard. Pulmonary/Chest: Effort normal. No respiratory distress.  Lymphadenopathy:    He has no cervical adenopathy.  Skin: No rash noted.   SH: no tobacco       Assessment & Plan:

## 2018-10-02 LAB — T-HELPER CELL (CD4) - (RCID CLINIC ONLY)
CD4 T CELL HELPER: 35 % (ref 33–55)
CD4 T Cell Abs: 570 /uL (ref 400–2700)

## 2018-10-03 LAB — HIV-1 RNA QUANT-NO REFLEX-BLD
HIV 1 RNA QUANT: NOT DETECTED {copies}/mL
HIV-1 RNA QUANT, LOG: NOT DETECTED {Log_copies}/mL

## 2018-10-06 ENCOUNTER — Other Ambulatory Visit: Payer: Self-pay | Admitting: Internal Medicine

## 2018-10-06 DIAGNOSIS — B20 Human immunodeficiency virus [HIV] disease: Secondary | ICD-10-CM

## 2018-10-10 MED FILL — BIKTARVY 50-200-25 MG TABS: 50-200-25 | 30 days supply | Qty: 30 | Fill #0

## 2018-11-07 MED FILL — BIKTARVY 50-200-25 MG TABS: 50-200-25 | 30 days supply | Qty: 30 | Fill #1

## 2018-12-05 MED FILL — BIKTARVY 50-200-25 MG TABS: 50-200-25 | 30 days supply | Qty: 30 | Fill #2

## 2019-01-01 MED FILL — BIKTARVY 50-200-25 MG TABS: 50-200-25 | 30 days supply | Qty: 30 | Fill #3

## 2019-02-03 MED FILL — BIKTARVY 50-200-25 MG TABS: 50-200-25 | 30 days supply | Qty: 30 | Fill #4

## 2019-02-10 ENCOUNTER — Ambulatory Visit: Payer: BLUE CROSS/BLUE SHIELD | Admitting: Internal Medicine

## 2019-03-04 MED FILL — BIKTARVY 50-200-25 MG TABS: 50-200-25 | 30 days supply | Qty: 30 | Fill #5

## 2019-03-30 ENCOUNTER — Other Ambulatory Visit: Payer: Self-pay | Admitting: Pharmacist

## 2019-03-30 DIAGNOSIS — B20 Human immunodeficiency virus [HIV] disease: Secondary | ICD-10-CM

## 2019-03-30 MED ORDER — BIKTARVY 50-200-25 MG PO TABS: 1.0000 | ORAL_TABLET | Freq: Every day | ORAL | 2 refills | Status: DC

## 2019-03-30 NOTE — Progress Notes (Signed)
Sending Biktarvy refills to [facility]. Patient does need a follow-up scheduled with Dr. Linus Salmons soon.

## 2019-04-06 MED FILL — BIKTARVY 50-200-25 MG TABS: 50-200-25 | 30 days supply | Qty: 30 | Fill #0

## 2019-05-12 MED FILL — BIKTARVY 50-200-25 MG TABS: 50-200-25 | 30 days supply | Qty: 30 | Fill #1

## 2019-06-10 MED FILL — BIKTARVY 50-200-25 MG TABS: 50-200-25 | 30 days supply | Qty: 30 | Fill #2

## 2019-07-03 ENCOUNTER — Other Ambulatory Visit: Payer: Self-pay | Admitting: Pharmacist

## 2019-07-03 DIAGNOSIS — B20 Human immunodeficiency virus [HIV] disease: Secondary | ICD-10-CM

## 2019-07-03 NOTE — Telephone Encounter (Signed)
Patient due for 6 month follow up, called without answer 07/03/2019 [facility]

## 2019-07-08 MED FILL — BIKTARVY 50-200-25 MG TABS: 50-200-25 | 30 days supply | Qty: 30 | Fill #0

## 2019-07-20 ENCOUNTER — Other Ambulatory Visit (HOSPITAL_COMMUNITY)
Admission: RE | Admit: 2019-07-20 | Discharge: 2019-07-20 | Disposition: A | Discharge status: Home / Self Care | Payer: BC Managed Care – PPO | Source: Ambulatory Visit | Attending: Internal Medicine | Admitting: Internal Medicine

## 2019-07-20 ENCOUNTER — Other Ambulatory Visit: Payer: BC Managed Care – PPO

## 2019-07-20 ENCOUNTER — Other Ambulatory Visit: Payer: Self-pay

## 2019-07-20 DIAGNOSIS — Z113 Encounter for screening for infections with a predominantly sexual mode of transmission: Secondary | ICD-10-CM

## 2019-07-20 DIAGNOSIS — B20 Human immunodeficiency virus [HIV] disease: Secondary | ICD-10-CM

## 2019-07-21 LAB — T-HELPER CELL (CD4) - (RCID CLINIC ONLY)
CD4 % Helper T Cell: 36 % (ref 33–65)
CD4 T Cell Abs: 688 /uL (ref 400–1790)

## 2019-07-21 LAB — URINE CYTOLOGY ANCILLARY ONLY
Chlamydia: NEGATIVE
Comment: NEGATIVE
Comment: NORMAL
Neisseria Gonorrhea: NEGATIVE

## 2019-07-24 LAB — CBC WITH DIFFERENTIAL/PLATELET
Absolute Monocytes: 400 cells/uL (ref 200–950)
Basophils Absolute: 32 cells/uL (ref 0–200)
Basophils Relative: 0.6 %
Eosinophils Absolute: 130 cells/uL (ref 15–500)
Eosinophils Relative: 2.4 %
HCT: 43.9 % (ref 38.5–50.0)
Hemoglobin: 15.4 g/dL (ref 13.2–17.1)
Lymphs Abs: 1885 cells/uL (ref 850–3900)
MCH: 32.4 pg (ref 27.0–33.0)
MCHC: 35.1 g/dL (ref 32.0–36.0)
MCV: 92.2 fL (ref 80.0–100.0)
MPV: 11 fL (ref 7.5–12.5)
Monocytes Relative: 7.4 %
Neutro Abs: 2954 cells/uL (ref 1500–7800)
Neutrophils Relative %: 54.7 %
Platelets: 254 10*3/uL (ref 140–400)
RBC: 4.76 10*6/uL (ref 4.20–5.80)
RDW: 12.3 % (ref 11.0–15.0)
Total Lymphocyte: 34.9 %
WBC: 5.4 10*3/uL (ref 3.8–10.8)

## 2019-07-24 LAB — COMPLETE METABOLIC PANEL WITH GFR
AG Ratio: 1.4 (calc) (ref 1.0–2.5)
ALT: 16 U/L (ref 9–46)
AST: 16 U/L (ref 10–40)
Albumin: 4.6 g/dL (ref 3.6–5.1)
Alkaline phosphatase (APISO): 77 U/L (ref 36–130)
BUN: 14 mg/dL (ref 7–25)
CO2: 24 mmol/L (ref 20–32)
Calcium: 9.8 mg/dL (ref 8.6–10.3)
Chloride: 102 mmol/L (ref 98–110)
Creat: 1.07 mg/dL (ref 0.60–1.35)
GFR, Est African American: 110 mL/min/{1.73_m2} (ref >=60)
GFR, Est Non African American: 95 mL/min/{1.73_m2} (ref >=60)
Globulin: 3.4 g/dL (calc) (ref 1.9–3.7)
Glucose, Bld: 66 mg/dL (ref 65–99)
Potassium: 3.9 mmol/L (ref 3.5–5.3)
Sodium: 140 mmol/L (ref 135–146)
Total Bilirubin: 0.4 mg/dL (ref 0.2–1.2)
Total Protein: 8 g/dL (ref 6.1–8.1)

## 2019-07-24 LAB — HIV-1 RNA QUANT-NO REFLEX-BLD
HIV 1 RNA Quant: <20 copies/mL
HIV-1 RNA Quant, Log: <1.3 Log copies/mL

## 2019-07-24 LAB — RPR: RPR Ser Ql: REACTIVE — AB

## 2019-07-24 LAB — FLUORESCENT TREPONEMAL AB(FTA)-IGG-BLD: Fluorescent Treponemal ABS: NONREACTIVE

## 2019-07-24 LAB — RPR TITER: RPR Titer: 1:1 {titer} — ABNORMAL HIGH

## 2019-08-05 ENCOUNTER — Ambulatory Visit: Payer: BLUE CROSS/BLUE SHIELD | Admitting: Internal Medicine

## 2019-08-10 ENCOUNTER — Other Ambulatory Visit: Payer: Self-pay | Admitting: Internal Medicine

## 2019-08-10 DIAGNOSIS — B20 Human immunodeficiency virus [HIV] disease: Secondary | ICD-10-CM

## 2019-08-11 MED FILL — BIKTARVY 50-200-25 MG TABS: 50-200-25 | 30 days supply | Qty: 30 | Fill #0

## 2019-08-25 ENCOUNTER — Other Ambulatory Visit: Payer: Self-pay

## 2019-08-25 ENCOUNTER — Ambulatory Visit (INDEPENDENT_AMBULATORY_CARE_PROVIDER_SITE_OTHER): Payer: BC Managed Care – PPO | Admitting: Internal Medicine

## 2019-08-25 ENCOUNTER — Encounter: Payer: Self-pay | Admitting: Internal Medicine

## 2019-08-25 VITALS — BP 125/75 | HR 61 | Temp 98.1°F | Wt 170.0 lb

## 2019-08-25 DIAGNOSIS — B20 Human immunodeficiency virus [HIV] disease: Secondary | ICD-10-CM | POA: Diagnosis not present

## 2019-08-25 DIAGNOSIS — Z7185 Encounter for immunization safety counseling: Secondary | ICD-10-CM

## 2019-08-25 DIAGNOSIS — Z79899 Other long term (current) drug therapy: Secondary | ICD-10-CM | POA: Diagnosis not present

## 2019-08-25 DIAGNOSIS — Z23 Encounter for immunization: Secondary | ICD-10-CM

## 2019-08-25 DIAGNOSIS — Z7189 Other specified counseling: Secondary | ICD-10-CM

## 2019-08-25 NOTE — Progress Notes (Signed)
   Subjective:    Patient ID: DONOVIN KRAEMER, male    DOB: 29-Dec-1990, 28 y.o.   MRN: [id number]  HPI Here for follow up of HIV He continues on Biktarvy and no missed doses.  No new issues.  CD4 of 688, viral load < 20.  No associated n/v/d.  No complaints.     Review of Systems  Constitutional: Negative for unexpected weight change.  Gastrointestinal: Negative for diarrhea and nausea.  Skin: Negative for rash.       Objective:   Physical Exam Constitutional:      Appearance: Normal appearance.  Eyes:     General: No scleral icterus. Cardiovascular:     Rate and Rhythm: Normal rate and regular rhythm.     Heart sounds: No murmur.  Pulmonary:     Effort: Pulmonary effort is normal.  Skin:    Findings: No rash.  Neurological:     General: No focal deficit present.     Mental Status: He is alert.  Psychiatric:        Mood and Affect: Mood normal.   SH: no tobacco        Assessment & Plan:

## 2019-09-08 MED FILL — BIKTARVY 50-200-25 MG TABS: 50-200-25 | 30 days supply | Qty: 30 | Fill #1

## 2019-10-05 MED FILL — BIKTARVY 50-200-25 MG TABS: 50-200-25 | 30 days supply | Qty: 30 | Fill #2

## 2019-11-03 MED FILL — BIKTARVY 50-200-25 MG TABS: 50-200-25 | 30 days supply | Qty: 30 | Fill #3

## 2019-11-25 ENCOUNTER — Other Ambulatory Visit (HOSPITAL_COMMUNITY): Payer: Self-pay | Admitting: Internal Medicine

## 2019-11-25 ENCOUNTER — Other Ambulatory Visit: Payer: Self-pay | Admitting: Internal Medicine

## 2019-11-25 DIAGNOSIS — B20 Human immunodeficiency virus [HIV] disease: Secondary | ICD-10-CM

## 2019-11-26 ENCOUNTER — Telehealth: Payer: Self-pay | Admitting: Pharmacy Technician

## 2019-11-26 MED FILL — BIKTARVY 50-200-25 MG TABS: 50-200-25 | 30 days supply | Qty: 30 | Fill #0

## 2019-11-26 NOTE — Telephone Encounter (Addendum)
[facility] Patient Advocate Encounter   Patient has been approved for [employer] Advancing Access Patient Assistance Program for [place] for up to 72-months coverage beginning 11/26/2019. This assistance will make the patient's copay $0. His insurance will not cover/pay for the medication exhausting the copayment card. He has no deductible.  I have spoken with the patient and they will pick up at [facility] later today.  The billing information is Member ID: [id number] RxBin: [id number] PCN: [id number] Group: [id number]  Beulah Gandy, CPhT Specialty Pharmacy Patient [facility] Phone: [phone] Fax: [phone] 11/26/2019 11:44 AM

## 2019-12-22 MED FILL — BIKTARVY 50-200-25 MG TABS: 50-200-25 | 30 days supply | Qty: 30 | Fill #1

## 2020-01-05 ENCOUNTER — Telehealth: Payer: Self-pay

## 2020-01-05 NOTE — Telephone Encounter (Signed)
Received call today from Mimi, Administer with ID in [place] requesting medical records (last office note and labs). States pt is transferring care; is scheduled for 01/19/20.  Will fax notes and labs. Mimi will fax signed release later today for our records. Patient confirms he is transferring care.  Will update provider. P: [phone] F: [phone] Lorenso Courier, CMA

## 2020-02-23 ENCOUNTER — Other Ambulatory Visit: Payer: BC Managed Care – PPO

## 2020-03-09 ENCOUNTER — Encounter: Payer: BC Managed Care – PPO | Admitting: Internal Medicine

## 2020-05-25 ENCOUNTER — Other Ambulatory Visit: Payer: Self-pay

## 2020-05-25 ENCOUNTER — Ambulatory Visit: Payer: Self-pay

## 2020-05-25 DIAGNOSIS — B20 Human immunodeficiency virus [HIV] disease: Secondary | ICD-10-CM

## 2020-05-26 LAB — T-HELPER CELL (CD4) - (RCID CLINIC ONLY)
CD4 % Helper T Cell: 31 % — ABNORMAL LOW (ref 33–65)
CD4 T Cell Abs: 484 /uL (ref 400–1790)

## 2020-05-27 LAB — HIV-1 RNA QUANT-NO REFLEX-BLD
HIV 1 RNA Quant: <20 Copies/mL — ABNORMAL HIGH
HIV-1 RNA Quant, Log: <1.3 Log cps/mL — ABNORMAL HIGH

## 2020-05-31 ENCOUNTER — Encounter: Payer: Self-pay | Admitting: Infectious Diseases

## 2020-06-06 ENCOUNTER — Other Ambulatory Visit: Payer: Self-pay

## 2020-06-06 DIAGNOSIS — B20 Human immunodeficiency virus [HIV] disease: Secondary | ICD-10-CM

## 2020-06-06 MED ORDER — BIKTARVY 50-200-25 MG PO TABS: 1.0000 | ORAL_TABLET | Freq: Every day | ORAL | 0 refills | Status: DC

## 2020-06-07 ENCOUNTER — Other Ambulatory Visit: Payer: Self-pay | Admitting: *Deleted

## 2020-06-07 DIAGNOSIS — B20 Human immunodeficiency virus [HIV] disease: Secondary | ICD-10-CM

## 2020-06-07 MED ORDER — BIKTARVY 50-200-25 MG PO TABS: 1.0000 | ORAL_TABLET | Freq: Every day | ORAL | 0 refills | Status: DC

## 2020-06-09 ENCOUNTER — Ambulatory Visit (INDEPENDENT_AMBULATORY_CARE_PROVIDER_SITE_OTHER): Payer: Self-pay | Admitting: Infectious Diseases

## 2020-06-09 ENCOUNTER — Ambulatory Visit (INDEPENDENT_AMBULATORY_CARE_PROVIDER_SITE_OTHER): Payer: Self-pay | Admitting: Pharmacist

## 2020-06-09 ENCOUNTER — Encounter: Payer: Self-pay | Admitting: Infectious Diseases

## 2020-06-09 ENCOUNTER — Encounter: Payer: Self-pay | Admitting: Emergency Medicine

## 2020-06-09 ENCOUNTER — Other Ambulatory Visit: Payer: Self-pay

## 2020-06-09 ENCOUNTER — Ambulatory Visit (INDEPENDENT_AMBULATORY_CARE_PROVIDER_SITE_OTHER): Payer: Self-pay | Admitting: Emergency Medicine

## 2020-06-09 VITALS — BP 134/73 | HR 64 | Temp 98.3°F | Wt 175.0 lb

## 2020-06-09 VITALS — BP 124/80 | HR 63 | Temp 97.5°F | Ht 71.0 in | Wt 174.2 lb

## 2020-06-09 DIAGNOSIS — B20 Human immunodeficiency virus [HIV] disease: Secondary | ICD-10-CM

## 2020-06-09 DIAGNOSIS — R911 Solitary pulmonary nodule: Secondary | ICD-10-CM

## 2020-06-09 DIAGNOSIS — R918 Other nonspecific abnormal finding of lung field: Secondary | ICD-10-CM

## 2020-06-09 MED ORDER — BICTEGRAVIR-EMTRICITAB-TENOFOV 50-200-25 MG PO TABS: 1.0000 | ORAL_TABLET | Freq: Every day | ORAL | 3 refills | Status: DC

## 2020-06-09 NOTE — Progress Notes (Signed)
[place], [place], [place], [address]                                                                  Phn. [phone]; Fax: [phone]                                                                             Date: 06/09/20  Reason for Visit: HIV returning to care Primary Care provider: Stoney Bang   HPI: Jesse Smith is a 29 y.o.old male with a PMH of HIV who is returning to care. Patient was diagnosed with HIV in April 2018 as part of risk factor screening. He established care with [facility] Dr Linus Salmons in 03/2017 and was started on [place] which he has been taking consistently since then. He has been virally undetectable since 09/2017. He is a MSM. However, denies being sexually active for sometime now. He was last seen in 08/2019 and after that moved to [place] where he stayed from March - August. However, returned back to [place] again before he could establish care. He has not seen any HIV providers there. He is back to [place]. He says he has been taking ART everyday without any missed doses. Denies any side effects. He denies smoking, drinking alcohol and using any illict drugs. He refused COVID vaccine and flu vaccine. He is unemployed currently. He is interested to see a dentist. He says he has a PCP and has an appt with pulm regarding a nodule in his lung.  ROS: Denies dysphagia, odynophagia, cough, fever, nausea, vomiting, diarrhea, constipation, weight loss, chills, night sweats, recent hospitalizations, rashes, joint complaints, shortness of breath, headaches, chest pain, abdominal pain, dysuria .  Current Outpatient Medications on File Prior to Visit  Medication Sig Dispense Refill  . bictegravir-emtricitabine-tenofovir AF (BIKTARVY) 50-200-25 MG TABS tablet Take 1 tablet by  mouth daily. 30 tablet 0  . cyclobenzaprine (FLEXERIL) 10 MG tablet Take 1 tablet (10 mg total) by mouth 3 (three) times daily as needed for muscle spasms. (Patient not taking: Reported on 06/09/2020) 20 tablet 0   No current facility-administered medications on file prior to visit.     Allergies  Allergen Reactions  . Erythromycin     Other reaction(s): Unknown  . Azithromycin Other (See Comments)    n/a  . Ceclor [Cefaclor] Other (See Comments)    n/a  . Penicillins Other (See Comments)    N/a childhood     Past Medical History:  Diagnosis Date  . Asthma    Past Surgical History:  Procedure Laterality Date  . TONSILLECTOMY     Allergies  Allergen Reactions  .  Erythromycin     Other reaction(s): Unknown  . Azithromycin Other (See Comments)    n/a  . Ceclor [Cefaclor] Other (See Comments)    n/a  . Penicillins Other (See Comments)    N/a childhood    Social History   Socioeconomic History  . Marital status: Single    Spouse name: Not on file  . Number of children: Not on file  . Years of education: Not on file  . Highest education level: Not on file  Occupational History  . Not on file  Tobacco Use  . Smoking status: Never Smoker  . Smokeless tobacco: Never Used  Substance and Sexual Activity  . Alcohol use: Yes    Comment: occasional  . Drug use: No  . Sexual activity: Not Currently    Partners: Male    Birth control/protection: Condom    Comment: declined condoms  Other Topics Concern  . Not on file  Social History Narrative  . Not on file   Social Determinants of Health   Financial Resource Strain:   . Difficulty of Paying Living Expenses: Not on file  Food Insecurity:   . Worried About Charity fundraiser in the Last Year: Not on file  . Ran Out of Food in the Last Year: Not on file  Transportation Needs:   . Lack of Transportation (Medical): Not on file  . Lack of Transportation (Non-Medical): Not on file  Physical Activity:   . Days of  Exercise per Week: Not on file  . Minutes of Exercise per Session: Not on file  Stress:   . Feeling of Stress : Not on file  Social Connections:   . Frequency of Communication with Friends and Family: Not on file  . Frequency of Social Gatherings with Friends and Family: Not on file  . Attends Religious Services: Not on file  . Active Member of Clubs or Organizations: Not on file  . Attends Archivist Meetings: Not on file  . Marital Status: Not on file  Intimate Partner Violence:   . Fear of Current or Ex-Partner: Not on file  . Emotionally Abused: Not on file  . Physically Abused: Not on file  . Sexually Abused: Not on file   Vitals  BP 134/73   Pulse 64   Temp 98.3 F (36.8 C) (Oral)   Wt 175 lb (79.4 kg)   BMI 24.41 kg/m    Examination  Gen: Alert and oriented x 3, no acute distress HEENT: [place]/AT, PERL,no scleral icterus, no pale conjunctivae, hearing normal, oral mucosa moist, DENTAL CARIES  Neck: Supple, no lymphadenopathy Cardio: Regular rate and rhythm; +S1 and S2; no murmurs, gallops, or rubs Resp: CTAB; no wheezes, rhonchi, or rales GI: Soft, nontender, nondistended, bowel sounds present GU: Musc: Extremities: No cyanosis, clubbing, or edema; +2 PT and DP pulses Skin: No rashes, lesions, or ecchymoses Neuro: No focal deficits Psych: Calm, cooperative  Lab Results HIV 1 RNA Quant  Date Value  05/25/2020 <20 Copies/mL (H)  07/20/2019 <20 NOT DETECTED copies/mL  10/01/2018 <20 NOT DETECTED copies/mL   CD4 T Cell Abs (/uL)  Date Value  05/25/2020 484  07/20/2019 688  10/01/2018 570   Lab Results  Component Value Date   HIV1GENOSEQ REPORT 03/21/2017   Lab Results  Component Value Date   WBC 5.4 07/20/2019   HGB 15.4 07/20/2019   HCT 43.9 07/20/2019   MCV 92.2 07/20/2019   PLT 254 07/20/2019    Lab Results  Component  Value Date   CREATININE 1.07 07/20/2019   BUN 14 07/20/2019   NA 140 07/20/2019   K 3.9 07/20/2019   CL 102  07/20/2019   CO2 24 07/20/2019   Lab Results  Component Value Date   ALT 16 07/20/2019   AST 16 07/20/2019   ALKPHOS 83 03/21/2017   BILITOT 0.4 07/20/2019    Lab Results  Component Value Date   CHOL 169 03/31/2018   TRIG 231 (H) 03/31/2018   HDL 30 (L) 03/31/2018   LDLCALC 104 (H) 03/31/2018   Lab Results  Component Value Date   HAV NON REACTIVE 03/21/2017   Lab Results  Component Value Date   HEPBSAG NEGATIVE 03/21/2017   HEPBSAB POS (A) 03/21/2017   Lab Results  Component Value Date   HCVAB NEGATIVE 03/21/2017   Lab Results  Component Value Date   CHLAMYDIAWP Negative 07/20/2019   N Negative 07/20/2019   No results found for: GCPROBEAPT Lab Results  Component Value Date   QUANTGOLD NEGATIVE 03/21/2017     Health Maintenance: Immunization History  Administered Date(s) Administered  . DTaP 06/08/1993, 08/03/1993, 10/11/1993, 05/15/1995, 11/15/1997  . HPV 9-valent 03/31/2018, 10/01/2018, 08/25/2019  . Hepatitis A, Adult 04/29/2017, 10/01/2018  . Hepatitis B 06/08/1993, 08/03/1993, 10/11/1993  . HiB (PRP-OMP) 06/08/1993, 08/03/1993, 10/11/1993, 05/15/1995  . IPV 06/08/1993, 08/03/1993, 10/11/1993, 11/15/1997  . MMR 02/06/1994, 11/15/1997  . Meningococcal Conjugate 04/09/2008  . Meningococcal Mcv4o 03/21/2017  . Pneumococcal Conjugate-13 03/31/2018  . Pneumococcal Polysaccharide-23 03/21/2017  . Pneumococcal-Unspecified 07/31/2000  . Tdap 04/09/2008   Assessment/Plan: HIV in a MSM Discussed with patient regarding side effects, benefits of treatment, long term outcomes.  Discussed the severity of untreated HIV including higher cancer risk, opportunistic infections, renal failure.  Discussed needing to use condoms, partner disclosure, necessary vaccines, blood monitoring.     Continue Biktarvy  Dental Referral placed Follow up in 4 weeks Orders Placed This Encounter  Procedures  . Comprehensive metabolic panel  . RPR    STD Screening  Urine RPR  and GC today    Immunization Refused   I spent greater than 30 minutes with the patient including greater than 50% of time in face to face counsel of the patient and in coordination of their care.    Patient's labs were reviewed as well as his previous records. Patients questions were addressed and answered. Safe sex counseling done.    Rosiland Oz, MD Infectious Diseases  Office phone [phone] Fax no. [phone]

## 2020-06-09 NOTE — Progress Notes (Signed)
I did not need to see patient today.

## 2020-06-09 NOTE — Patient Instructions (Signed)
We will repeat your CT scan of the chest and late November 2021 to compare with prior.  We will get a copy of your scan from [facility] Follow with Dr. Delton Coombes in December after the scan so that we can review together.  Depending on the findings we can discuss whether any other work-up is necessary including possible bronchoscopy.

## 2020-06-09 NOTE — Progress Notes (Signed)
Subjective:    Patient ID: Jesse Smith, male    DOB: 11/29/1990, 29 y.o.   MRN: [id number]  HPI 29 year old never smoker who carries a history of HIV, allergies and childhood asthma.   He is referred today for evaluation of an abnormal CT with pulmonary nodule.  He was evaluated for chest discomfort 05/15/2020. Pleuritic in nature, left sided and then up the arm. No cough or mucous production. He also had a rash and was treated with doxycycline x 3 days and then stopped. This prompted chest x-ray and then [facility] at [facility], I have reviewed, shows focal 14 x 12 mm left lower lobe opacity with an associated 7 mm and 4 mm left lower lobe nodules, subpleural right middle lobe 5 mm nodule of unclear etiology.   05/25/20 HIV RNA < 20; CD4 484 (31%).     Review of Systems As per HPI  Past Medical History:  Diagnosis Date  . Asthma      No family history on file.   Social History   Socioeconomic History  . Marital status: Single    Spouse name: Not on file  . Number of children: Not on file  . Years of education: Not on file  . Highest education level: Not on file  Occupational History  . Not on file  Tobacco Use  . Smoking status: Never Smoker  . Smokeless tobacco: Never Used  Substance and Sexual Activity  . Alcohol use: Yes    Comment: occasional  . Drug use: No  . Sexual activity: Not Currently    Partners: Male    Birth control/protection: Condom    Comment: declined condoms  Other Topics Concern  . Not on file  Social History Narrative  . Not on file   Social Determinants of Health   Financial Resource Strain:   . Difficulty of Paying Living Expenses: Not on file  Food Insecurity:   . Worried About Programme researcher, broadcasting/film/video in the Last Year: Not on file  . Ran Out of Food in the Last Year: Not on file  Transportation Needs:   . Lack of Transportation (Medical): Not on file  . Lack of Transportation (Non-Medical): Not on file  Physical Activity:   . Days of Exercise  per Week: Not on file  . Minutes of Exercise per Session: Not on file  Stress:   . Feeling of Stress : Not on file  Social Connections:   . Frequency of Communication with Friends and Family: Not on file  . Frequency of Social Gatherings with Friends and Family: Not on file  . Attends Religious Services: Not on file  . Active Member of Clubs or Organizations: Not on file  . Attends Banker Meetings: Not on file  . Marital Status: Not on file  Intimate Partner Violence:   . Fear of Current or Ex-Partner: Not on file  . Emotionally Abused: Not on file  . Physically Abused: Not on file  . 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  No other exposures.   Allergies  Allergen Reactions  . Erythromycin     Other reaction(s): Unknown  . Azithromycin Other (See Comments)    n/a  . Ceclor [Cefaclor] Other (See Comments)    n/a  . Penicillins Other (See Comments)    N/a childhood      Outpatient Medications Prior to Visit  Medication Sig Dispense Refill  . bictegravir-emtricitabine-tenofovir AF (  BIKTARVY) 50-200-25 MG TABS tablet Take 1 tablet by mouth daily. 30 tablet 0  . cyclobenzaprine (FLEXERIL) 10 MG tablet Take 1 tablet (10 mg total) by mouth 3 (three) times daily as needed for muscle spasms. (Patient not taking: Reported on 06/09/2020) 20 tablet 0   No facility-administered medications prior to visit.        Objective:   Physical Exam  Vitals:   06/09/20 1043  BP: 124/80  Pulse: 63  Temp: (!) 97.5 F (36.4 C)  TempSrc: Temporal  SpO2: 98%  Weight: 174 lb 3.2 oz (79 kg)  Height: 5\' 11"  (1.803 m)   Gen: Pleasant, well-nourished, in no distress,  normal affect  ENT: No lesions,  mouth clear,  oropharynx clear, no postnasal drip  Neck: No JVD, no stridor  Lungs: No use of accessory muscles, no crackles or wheezing on normal respiration, no wheeze on forced expiration  Cardiovascular: RRR, heart sounds normal, no murmur  or gallops, no peripheral edema  Musculoskeletal: No deformities, no cyanosis or clubbing  Neuro: alert, awake, non focal  Skin: Warm, no lesions or rash      Assessment & Plan:  Pulmonary nodule In patient with history of HIV but intact immune function.  No clear evidence by CT for opportunistic infection-this nodule is inconsistent.  We will repeat your CT scan of the chest and late November 2021 to compare with prior.  We will get a copy of your scan from [facility] Follow with Dr. December 2021 in December after the scan so that we can review together.  Depending on the findings we can discuss whether any other work-up is necessary including possible bronchoscopy.  January, MD, PhD 06/15/2020, 5:19 PM [facility] [phone] or if no answer [phone]

## 2020-06-10 ENCOUNTER — Other Ambulatory Visit: Payer: Self-pay | Admitting: Infectious Diseases

## 2020-06-10 DIAGNOSIS — R7401 Elevation of levels of liver transaminase levels: Secondary | ICD-10-CM

## 2020-06-10 LAB — COMPREHENSIVE METABOLIC PANEL
AG Ratio: 0.9 (calc) — ABNORMAL LOW (ref 1.0–2.5)
ALT: 164 U/L — ABNORMAL HIGH (ref 9–46)
AST: 94 U/L — ABNORMAL HIGH (ref 10–40)
Albumin: 3.8 g/dL (ref 3.6–5.1)
Alkaline phosphatase (APISO): 1121 U/L — ABNORMAL HIGH (ref 36–130)
BUN: 8 mg/dL (ref 7–25)
CO2: 30 mmol/L (ref 20–32)
Calcium: 9.6 mg/dL (ref 8.6–10.3)
Chloride: 100 mmol/L (ref 98–110)
Creat: 0.93 mg/dL (ref 0.60–1.35)
Globulin: 4.2 g/dL (calc) — ABNORMAL HIGH (ref 1.9–3.7)
Glucose, Bld: 99 mg/dL (ref 65–99)
Potassium: 4.3 mmol/L (ref 3.5–5.3)
Sodium: 136 mmol/L (ref 135–146)
Total Bilirubin: 1.8 mg/dL — ABNORMAL HIGH (ref 0.2–1.2)
Total Protein: 8 g/dL (ref 6.1–8.1)

## 2020-06-10 LAB — FLUORESCENT TREPONEMAL AB(FTA)-IGG-BLD: Fluorescent Treponemal ABS: REACTIVE — AB

## 2020-06-10 LAB — RPR: RPR Ser Ql: REACTIVE — AB

## 2020-06-10 LAB — RPR TITER: RPR Titer: 1:16 {titer} — ABNORMAL HIGH

## 2020-06-10 NOTE — Progress Notes (Signed)
Could you please let him know that his liver function test is abnormal? I have ordered some labs for acute hepatitis panel to rule out Hepatitis A/B and C. It has been a while he has not had these tests. He had a recent US of liver done that was normal.

## 2020-06-10 NOTE — Progress Notes (Signed)
He needs Benzathine Penicillin G 2.4 million units one dose IM. Thanks.

## 2020-06-13 LAB — URINE CYTOLOGY ANCILLARY ONLY
Chlamydia: NEGATIVE
Comment: NEGATIVE
Comment: NORMAL
Neisseria Gonorrhea: NEGATIVE

## 2020-06-14 ENCOUNTER — Ambulatory Visit: Payer: Self-pay

## 2020-06-15 DIAGNOSIS — R911 Solitary pulmonary nodule: Secondary | ICD-10-CM | POA: Insufficient documentation

## 2020-06-15 NOTE — Assessment & Plan Note (Signed)
In patient with history of HIV but intact immune function.  No clear evidence by CT for opportunistic infection-this nodule is inconsistent.  We will repeat your CT scan of the chest and late November 2021 to compare with prior.  We will get a copy of your scan from [facility] Follow with Dr. Delton Coombes in December after the scan so that we can review together.  Depending on the findings we can discuss whether any other work-up is necessary including possible bronchoscopy.

## 2020-06-16 ENCOUNTER — Ambulatory Visit: Payer: Self-pay

## 2020-06-16 ENCOUNTER — Other Ambulatory Visit: Payer: Self-pay

## 2020-06-16 ENCOUNTER — Telehealth: Payer: Self-pay

## 2020-06-16 ENCOUNTER — Other Ambulatory Visit: Payer: Self-pay | Admitting: Infectious Diseases

## 2020-06-16 DIAGNOSIS — A539 Syphilis, unspecified: Secondary | ICD-10-CM

## 2020-06-16 MED ORDER — DOXYCYCLINE HYCLATE 100 MG PO TABS: 100.0000 mg | ORAL_TABLET | Freq: Two times a day (BID) | ORAL | 0 refills | Status: AC

## 2020-06-16 NOTE — Telephone Encounter (Signed)
Doxycycline 100mg  PO BID for 14 days sent to the patients preferred pharmacy. Thanks

## 2020-06-16 NOTE — Telephone Encounter (Signed)
Patient arrived for nurse visit for Bicillin treatment. Patient noted to have penicillin allergy. Dr. Elinor Parkinson to send in alternate treatment to [facility] pharmacy in [place], [place].  Sandie Ano, RN

## 2020-06-16 NOTE — Progress Notes (Signed)
Patient not seen. Medications sent to pharmacy.  Sandie Ano, RN

## 2020-08-15 ENCOUNTER — Other Ambulatory Visit: Payer: Self-pay

## 2020-08-23 ENCOUNTER — Inpatient Hospital Stay: Admission: RE | Admit: 2020-08-23 | Payer: Self-pay | Source: Ambulatory Visit

## 2020-09-07 ENCOUNTER — Ambulatory Visit: Payer: Self-pay | Admitting: Emergency Medicine

## 2020-09-20 ENCOUNTER — Ambulatory Visit: Payer: Self-pay | Admitting: Infectious Diseases

## 2020-09-20 ENCOUNTER — Ambulatory Visit: Payer: Self-pay

## 2020-09-22 ENCOUNTER — Encounter: Payer: Self-pay | Admitting: Infectious Diseases

## 2020-09-22 ENCOUNTER — Ambulatory Visit: Payer: Self-pay

## 2020-09-22 ENCOUNTER — Other Ambulatory Visit: Payer: Self-pay

## 2020-09-22 ENCOUNTER — Ambulatory Visit (INDEPENDENT_AMBULATORY_CARE_PROVIDER_SITE_OTHER): Payer: Self-pay | Admitting: Infectious Diseases

## 2020-09-22 VITALS — BP 137/75 | HR 63 | Temp 98.1°F | Wt 181.0 lb

## 2020-09-22 DIAGNOSIS — Z7185 Encounter for immunization safety counseling: Secondary | ICD-10-CM | POA: Insufficient documentation

## 2020-09-22 DIAGNOSIS — Z113 Encounter for screening for infections with a predominantly sexual mode of transmission: Secondary | ICD-10-CM

## 2020-09-22 DIAGNOSIS — B20 Human immunodeficiency virus [HIV] disease: Secondary | ICD-10-CM

## 2020-09-22 MED ORDER — BIKTARVY 50-200-25 MG PO TABS: 1.0000 | ORAL_TABLET | Freq: Every day | ORAL | 0 refills | Status: DC

## 2020-09-22 MED ORDER — BICTEGRAVIR-EMTRICITAB-TENOFOV 50-200-25 MG PO TABS: 1.0000 | ORAL_TABLET | Freq: Every day | ORAL | 3 refills | Status: DC

## 2020-09-22 NOTE — Assessment & Plan Note (Signed)
Urine GC and RPR today

## 2020-09-22 NOTE — Addendum Note (Signed)
Addended by: Harley Alto on: 09/22/2020 02:30 PM   Modules accepted: Orders

## 2020-09-22 NOTE — Progress Notes (Signed)
[place], [place], [place], [address]                                                                  Phn. 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 He has completed 14 day tx course of Doxycyline given his allergy to Penicillin  He is taking Biktarvy every single day, No missed doses. Denies any barriers to adherence of treatment .  Not sexually active. Last time sexually active was 6 months.  Smoking - none, alcohol - drinks socially, none since may, No drug use  Works in [employer] as a Freight forwarder   He does not want to be vaccinated today   ROS: Denies dysphagia, odynophagia, cough, fever, nausea, vomiting, diarrhea, constipation, weight loss, chills, night sweats, recent hospitalizations, rashes, joint complaints, shortness of breath, headaches, chest pain, abdominal pain, dysuria .                                         Current Outpatient Medications on File Prior to Visit  Medication Sig Dispense Refill  . cyclobenzaprine (FLEXERIL) 10 MG tablet Take 1 tablet (10 mg total) by mouth 3 (three) times daily as needed for muscle spasms. (Patient not taking: Reported on 09/22/2020) 20 tablet 0   No current facility-administered medications on file prior to visit.  '  Allergies  Allergen Reactions  . Erythromycin     Other reaction(s): Unknown  . Azithromycin Other (See Comments)    n/a  . Ceclor  [Cefaclor] Other (See Comments)    n/a  . Penicillins Hives    N/a childhood     Allergies  Allergen Reactions  . Erythromycin     Other reaction(s): Unknown  . Azithromycin Other (See Comments)    n/a  . Ceclor [Cefaclor] Other (See Comments)    n/a  . Penicillins Hives    N/a childhood    Past Medical History:  Diagnosis Date  . Asthma    Past Surgical History:  Procedure Laterality Date  .  TONSILLECTOMY     Social History   Socioeconomic History  . Marital status: Single    Spouse name: Not on file  . Number of children: Not on file  . Years of education: Not on file  . Highest education level: Not on file  Occupational History  . Not on file  Tobacco Use  . Smoking status: Never Smoker  . Smokeless tobacco: Never Used  Substance and Sexual Activity  . Alcohol use: Yes    Comment: occasional  . Drug use: No  . Sexual activity: Not Currently    Partners: Male    Birth control/protection: Condom    Comment: declined  CONDOMS 09/22/20  Other Topics Concern  . Not on file  Social History Narrative  . Not on file   Social Determinants of Health   Financial Resource Strain: Not on file  Food Insecurity: Not on file  Transportation Needs: Not on file  Physical Activity: Not on file  Stress: Not on file  Social Connections: Not on file  Intimate Partner Violence: Not on file     Vitals  BP 137/75   Pulse 63   Temp 98.1 F (36.7 C) (Oral)   Wt 181 lb (82.1 kg)   BMI 25.24 kg/m    Examination  Gen: Alert and oriented x 3, no acute distress HEENT: /AT, PERL, no scleral icterus, no pale conjunctivae, hearing normal, oral mucosa moist, No thrush  Neck: Supple, no lymphadenopathy Cardio: Regular rate and rhythm; +S1 and S2; no murmurs, gallops, or rubs Resp: CTAB; no wheezes, rhonchi, or rales GI: Soft, nontender, nondistended, bowel sounds present GU: Musc: Extremities: No cyanosis, clubbing, or edema; +2 PT and DP pulses Skin: No rashes,  lesions, or ecchymoses Neuro: No focal deficits Psych: Calm, cooperative   Lab Results HIV 1 RNA Quant  Date Value  05/25/2020 <20 Copies/mL (H)  07/20/2019 <20 NOT DETECTED copies/mL  10/01/2018 <20 NOT DETECTED copies/mL   CD4 T Cell Abs (/uL)  Date Value  05/25/2020 484  07/20/2019 688  10/01/2018 570   Lab Results  Component Value Date   HIV1GENOSEQ REPORT 03/21/2017   Lab Results  Component Value Date   WBC 5.4 07/20/2019   HGB 15.4 07/20/2019   HCT 43.9 07/20/2019   MCV 92.2 07/20/2019   PLT 254 07/20/2019    Lab Results  Component Value Date   CREATININE 0.93 06/09/2020   BUN 8 06/09/2020   NA 136 06/09/2020   K 4.3 06/09/2020   CL 100 06/09/2020   CO2 30 06/09/2020   Lab Results  Component Value Date   ALT 164 (H) 06/09/2020   AST 94 (H) 06/09/2020   ALKPHOS 83 03/21/2017   BILITOT 1.8 (H) 06/09/2020    Lab Results  Component Value Date   CHOL 169 03/31/2018   TRIG 231 (H) 03/31/2018   HDL 30 (L) 03/31/2018   LDLCALC 104 (H) 03/31/2018   Lab Results  Component Value Date   HAV NON REACTIVE 03/21/2017   Lab Results  Component Value Date   HEPBSAG NEGATIVE 03/21/2017   HEPBSAB POS (A) 03/21/2017   Lab Results  Component Value Date   HCVAB NEGATIVE 03/21/2017   Lab Results  Component Value Date   CHLAMYDIAWP Negative 06/09/2020   N Negative 06/09/2020   No results found for: GCPROBEAPT Lab Results  Component Value Date   QUANTGOLD NEGATIVE 03/21/2017     Health Maintenance: Immunization History  Administered Date(s) Administered  . DTaP 06/08/1993, 08/03/1993, 10/11/1993,  05/15/1995, 11/15/1997  . HPV 9-valent 03/31/2018, 10/01/2018, 08/25/2019  . Hepatitis A, Adult 04/29/2017, 10/01/2018  . Hepatitis B 06/08/1993, 08/03/1993, 10/11/1993  . HiB (PRP-OMP) 06/08/1993, 08/03/1993, 10/11/1993, 05/15/1995  . IPV 06/08/1993, 08/03/1993, 10/11/1993, 11/15/1997  . MMR 02/06/1994, 11/15/1997  . Meningococcal Conjugate 04/09/2008  .  Meningococcal Mcv4o 03/21/2017  . Pneumococcal Conjugate-13 03/31/2018  . Pneumococcal Polysaccharide-23 03/21/2017  . Pneumococcal-Unspecified 07/31/2000  . Tdap 04/09/2008       Assessment/Plan: HIV Continue Biktarvy  Fu in 3 months  Orders Placed This Encounter  Procedures  . HIV-1 RNA ultraquant reflex to gentyp+  . T-helper cell (CD4)- ([facility] clinic only)  . Comprehensive metabolic panel  . Alkaline phosphatase  . Lipid panel  . RPR  . Hepatitis B core antibody, IgM  . Hepatitis B core antibody, total  . Hepatitis B DNA, ultraquantitative, PCR  . Hepatitis B e antibody  . Hepatitis B e antigen  . Hepatitis B surface antibody,qualitative  . Hepatitis B surface antigen   Possible past h/o Hep B infection   03/21/2017 Hep B surface ag negative, Hep B surface ab positive and Hep B core ab reactive Will get further labs for Hep B as above  STD Screening  Urine GC and RPR today   Immunization Declined all vaccines   I have spent more than 25 minutes including face to face encounter and non face to face encounter activities for this visit  Patient's labs were reviewed as well as his previous records. Patients questions were addressed and answered. Safe sex counseling done.   Electronically signed by:  Rosiland Oz, MD Infectious Disease Physician [facility] [address] [place], [place] [address] Phone: [phone]  Fax: [phone]

## 2020-09-22 NOTE — Assessment & Plan Note (Signed)
Continue Biktarvy Fu in 3 months 

## 2020-09-22 NOTE — Assessment & Plan Note (Signed)
Discussed about recommended vaccines in [facility]. He declined Will continue to discuss in next clinic visit

## 2020-09-23 LAB — T-HELPER CELL (CD4) - (RCID CLINIC ONLY)
CD4 % Helper T Cell: 38 % (ref 33–65)
CD4 T Cell Abs: 638 /uL (ref 400–1790)

## 2020-10-01 LAB — LIPID PANEL
Cholesterol: 166 mg/dL (ref <200)
HDL: 27 mg/dL — ABNORMAL LOW (ref >=40)
LDL Cholesterol (Calc): 108 mg/dL (calc) — ABNORMAL HIGH
Non-HDL Cholesterol (Calc): 139 mg/dL (calc) — ABNORMAL HIGH (ref <130)
Total CHOL/HDL Ratio: 6.1 (calc) — ABNORMAL HIGH (ref <5.0)
Triglycerides: 185 mg/dL — ABNORMAL HIGH (ref <150)

## 2020-10-01 LAB — COMPREHENSIVE METABOLIC PANEL
AG Ratio: 1.4 (calc) (ref 1.0–2.5)
ALT: 15 U/L (ref 9–46)
AST: 14 U/L (ref 10–40)
Albumin: 4.3 g/dL (ref 3.6–5.1)
Alkaline phosphatase (APISO): 77 U/L (ref 36–130)
BUN: 13 mg/dL (ref 7–25)
CO2: 29 mmol/L (ref 20–32)
Calcium: 9.6 mg/dL (ref 8.6–10.3)
Chloride: 102 mmol/L (ref 98–110)
Creat: 1.06 mg/dL (ref 0.60–1.35)
Globulin: 3.1 g/dL (calc) (ref 1.9–3.7)
Glucose, Bld: 90 mg/dL (ref 65–99)
Potassium: 3.7 mmol/L (ref 3.5–5.3)
Sodium: 139 mmol/L (ref 135–146)
Total Bilirubin: 0.5 mg/dL (ref 0.2–1.2)
Total Protein: 7.4 g/dL (ref 6.1–8.1)

## 2020-10-01 LAB — RPR: RPR Ser Ql: REACTIVE — AB

## 2020-10-01 LAB — HEPATITIS B SURFACE ANTIBODY,QUALITATIVE: Hep B S Ab: REACTIVE — AB

## 2020-10-01 LAB — HEPATITIS B E ANTIBODY: Hep B E Ab: NONREACTIVE

## 2020-10-01 LAB — HEPATITIS B DNA, ULTRAQUANTITATIVE, PCR
Hepatitis B DNA (Calc): <1 Log IU/mL
Hepatitis B DNA: <10 IU/mL

## 2020-10-01 LAB — HIV-1 RNA ULTRAQUANT REFLEX TO GENTYP+
HIV 1 RNA Quant: <20 copies/mL
HIV-1 RNA Quant, Log: <1.3 Log copies/mL

## 2020-10-01 LAB — HEPATITIS B CORE ANTIBODY, TOTAL: Hep B Core Total Ab: REACTIVE — AB

## 2020-10-01 LAB — HEPATITIS B E ANTIGEN: Hep B E Ag: NONREACTIVE

## 2020-10-01 LAB — FLUORESCENT TREPONEMAL AB(FTA)-IGG-BLD: Fluorescent Treponemal ABS: REACTIVE — AB

## 2020-10-01 LAB — HEPATITIS B CORE ANTIBODY, IGM: Hep B C IgM: NONREACTIVE

## 2020-10-01 LAB — HEPATITIS B SURFACE ANTIGEN: Hepatitis B Surface Ag: NONREACTIVE

## 2020-10-01 LAB — RPR TITER: RPR Titer: 1:2 {titer} — ABNORMAL HIGH

## 2020-10-05 ENCOUNTER — Inpatient Hospital Stay: Admission: RE | Admit: 2020-10-05 | Payer: Self-pay | Source: Ambulatory Visit

## 2020-10-14 ENCOUNTER — Telehealth: Payer: Self-pay

## 2020-10-14 MED FILL — BIKTARVY 50-200-25 MG TABS: 50-200-25 | 30 days supply | Qty: 30 | Fill #2

## 2020-10-14 NOTE — Telephone Encounter (Signed)
[facility] Patient Advocate Encounter  Completed and sent [employer] Advancing Access application for Biktarvy for this patient who is uninsured.    Patient is approved 10/14/20 through 11/25/20.  BIN      [id number] PCN    [id number]  GRP    [id number] ID        [id number]   [name], CPhT Specialty Pharmacy Patient Advocate [facility] Phone: [phone] Fax:  [phone]

## 2020-11-03 MED FILL — BIKTARVY 50-200-25 MG TABS: 50-200-25 | 30 days supply | Qty: 30 | Fill #2

## 2020-11-04 ENCOUNTER — Other Ambulatory Visit: Payer: Self-pay

## 2020-11-04 ENCOUNTER — Ambulatory Visit (INDEPENDENT_AMBULATORY_CARE_PROVIDER_SITE_OTHER)
Admission: RE | Admit: 2020-11-04 | Discharge: 2020-11-04 | Disposition: A | Discharge status: Home / Self Care | Payer: BC Managed Care – PPO | Source: Ambulatory Visit | Attending: Emergency Medicine | Admitting: Emergency Medicine

## 2020-11-04 DIAGNOSIS — R918 Other nonspecific abnormal finding of lung field: Secondary | ICD-10-CM | POA: Diagnosis not present

## 2020-11-29 ENCOUNTER — Other Ambulatory Visit (HOSPITAL_COMMUNITY): Payer: Self-pay | Admitting: Infectious Diseases

## 2020-11-29 ENCOUNTER — Other Ambulatory Visit: Payer: Self-pay | Admitting: Internal Medicine

## 2020-11-29 DIAGNOSIS — B20 Human immunodeficiency virus [HIV] disease: Secondary | ICD-10-CM

## 2020-12-16 ENCOUNTER — Other Ambulatory Visit (HOSPITAL_COMMUNITY): Payer: Self-pay

## 2020-12-21 ENCOUNTER — Ambulatory Visit: Payer: Self-pay | Admitting: Infectious Diseases

## 2020-12-23 ENCOUNTER — Ambulatory Visit: Payer: Self-pay | Admitting: Infectious Diseases

## 2020-12-23 ENCOUNTER — Ambulatory Visit: Payer: BC Managed Care – PPO

## 2020-12-26 ENCOUNTER — Other Ambulatory Visit (HOSPITAL_COMMUNITY): Payer: Self-pay

## 2020-12-26 ENCOUNTER — Other Ambulatory Visit: Payer: Self-pay | Admitting: Infectious Diseases

## 2020-12-29 ENCOUNTER — Other Ambulatory Visit: Payer: Self-pay

## 2020-12-29 ENCOUNTER — Telehealth (INDEPENDENT_AMBULATORY_CARE_PROVIDER_SITE_OTHER): Payer: BC Managed Care – PPO | Admitting: Internal Medicine

## 2020-12-29 ENCOUNTER — Ambulatory Visit: Payer: BC Managed Care – PPO

## 2020-12-29 ENCOUNTER — Other Ambulatory Visit (HOSPITAL_COMMUNITY): Payer: Self-pay

## 2020-12-29 ENCOUNTER — Encounter: Payer: Self-pay | Admitting: Internal Medicine

## 2020-12-29 DIAGNOSIS — B20 Human immunodeficiency virus [HIV] disease: Secondary | ICD-10-CM | POA: Diagnosis not present

## 2020-12-29 MED ORDER — BICTEGRAVIR-EMTRICITAB-TENOFOV 50-200-25 MG PO TABS
1.0000 | ORAL_TABLET | Freq: Every day | ORAL | 11 refills | Status: DC
  Filled 2020-12-29 – 2021-01-02 (×3): qty 30, 30d supply, fill #0
  Filled 2021-01-23: qty 30, 30d supply, fill #1
  Filled 2021-02-27 – 2021-03-08 (×4): qty 30, 30d supply, fill #2

## 2020-12-29 NOTE — Assessment & Plan Note (Signed)
He is doing well with no significant concerns.  Recent labs good.  He will return in 3-4 months for routine follow up with labs and follow up video visit.

## 2020-12-29 NOTE — Progress Notes (Signed)
   Subjective:    Patient ID: Jesse Smith, male    DOB: Oct 21, 1990, 30 y.o.   MRN: [id number]  HPI I connected with  LAVARIUS DOUGHTEN on 12/29/20 by a video enabled telemedicine application and verified that I am speaking with the correct person using two identifiers.   I discussed the limitations of evaluation and management by telemedicine. The patient expressed understanding and agreed to proceed.  Location: Patient - home Physician - clinic  Duration of visit: 10 minutes  HPI: visit is for follow up of HIV He was last seen in January of this year by my partner and labs noted a good CD4 count of 638 and viral load < 20.  Creat, LFTs wnl.  He was to follow up now for routine care.  He did not have any other recent labs but was unable to come in for a visit in person.  He is doing well with no missed doses of the Biktarvy.  Since his last visit, he had a fall and broke his right ankle and underwent closed reduction.     Review of Systems  Constitutional: Negative for unexpected weight change.  Gastrointestinal: Negative for diarrhea and nausea.  Skin: Negative for rash.       Objective:   Physical Exam Neurological:     Mental Status: He is alert.  Psychiatric:        Mood and Affect: Mood normal.        Behavior: Behavior normal.           Assessment & Plan:

## 2021-01-02 ENCOUNTER — Telehealth: Payer: Self-pay

## 2021-01-02 ENCOUNTER — Other Ambulatory Visit (HOSPITAL_COMMUNITY): Payer: Self-pay

## 2021-01-02 NOTE — Telephone Encounter (Signed)
[facility] Patient Advocate Encounter   I was successful in securing patient a $7500 grant from [employer] ([employer]) to provide copayment coverage for Biktarvy.  This will make the out of pocket cost $0.00.     I have spoken with the patient.    The billing information is as follows and has been shared with [facility].           Patient knows to call the office with questions or concerns.  Rimas Gilham, CPhT Specialty Pharmacy [facility] Phone: [phone] Fax:  [phone]  

## 2021-01-11 ENCOUNTER — Other Ambulatory Visit (HOSPITAL_COMMUNITY): Payer: Self-pay

## 2021-01-23 ENCOUNTER — Other Ambulatory Visit (HOSPITAL_COMMUNITY): Payer: Self-pay

## 2021-02-01 ENCOUNTER — Other Ambulatory Visit (HOSPITAL_COMMUNITY): Payer: Self-pay

## 2021-02-27 ENCOUNTER — Other Ambulatory Visit (HOSPITAL_COMMUNITY): Payer: Self-pay

## 2021-03-07 ENCOUNTER — Other Ambulatory Visit (HOSPITAL_COMMUNITY): Payer: Self-pay

## 2021-03-08 ENCOUNTER — Other Ambulatory Visit (HOSPITAL_COMMUNITY): Payer: Self-pay

## 2021-03-09 ENCOUNTER — Other Ambulatory Visit: Payer: Self-pay | Admitting: Pharmacist

## 2021-03-09 ENCOUNTER — Other Ambulatory Visit (HOSPITAL_COMMUNITY): Payer: Self-pay

## 2021-03-09 ENCOUNTER — Telehealth: Payer: Self-pay

## 2021-03-09 DIAGNOSIS — B20 Human immunodeficiency virus [HIV] disease: Secondary | ICD-10-CM

## 2021-03-09 MED ORDER — DOVATO 50-300 MG PO TABS
1.0000 | ORAL_TABLET | Freq: Every day | ORAL | 5 refills | Status: DC
  Filled 2021-03-09: qty 30, 30d supply, fill #0
  Filled 2021-03-09: qty 30, fill #0
  Filled 2021-03-29 – 2021-04-13 (×2): qty 30, 30d supply, fill #1
  Filled 2021-05-08: qty 30, 30d supply, fill #2
  Filled 2021-06-02: qty 30, 30d supply, fill #3
  Filled 2021-07-10 – 2021-07-20 (×2): qty 30, 30d supply, fill #4
  Filled 2021-08-14 – 2021-08-25 (×2): qty 30, 30d supply, fill #5

## 2021-03-09 NOTE — Telephone Encounter (Signed)
[facility] Patient Advocate Encounter   Was successful in obtaining a Viiv copay card for Dovato.  This copay card will make the patients copay $0.00.  I have spoken with the patient.    The billing information is as follows and has been shared with [facility].         Golden Emile, CPhT Specialty Pharmacy [facility] Phone: [phone] Fax:  [phone]  

## 2021-03-09 NOTE — Progress Notes (Signed)
Patient's insurance will not cover much of Biktarvy and he has ran out of copay options. Will switch to Dovato so that he can afford his medication. Dr. Luciana Axe aware.

## 2021-03-29 ENCOUNTER — Other Ambulatory Visit (HOSPITAL_COMMUNITY): Payer: Self-pay

## 2021-04-03 ENCOUNTER — Other Ambulatory Visit (HOSPITAL_COMMUNITY): Payer: Self-pay

## 2021-04-11 ENCOUNTER — Other Ambulatory Visit (HOSPITAL_COMMUNITY): Payer: Self-pay

## 2021-04-13 ENCOUNTER — Other Ambulatory Visit (HOSPITAL_COMMUNITY): Payer: Self-pay

## 2021-05-08 ENCOUNTER — Other Ambulatory Visit (HOSPITAL_COMMUNITY): Payer: Self-pay

## 2021-05-11 ENCOUNTER — Other Ambulatory Visit (HOSPITAL_COMMUNITY): Payer: Self-pay

## 2021-06-02 ENCOUNTER — Other Ambulatory Visit (HOSPITAL_COMMUNITY): Payer: Self-pay

## 2021-06-08 ENCOUNTER — Other Ambulatory Visit (HOSPITAL_COMMUNITY): Payer: Self-pay

## 2021-07-10 ENCOUNTER — Other Ambulatory Visit (HOSPITAL_COMMUNITY): Payer: Self-pay

## 2021-07-19 ENCOUNTER — Other Ambulatory Visit (HOSPITAL_COMMUNITY): Payer: Self-pay

## 2021-07-20 ENCOUNTER — Other Ambulatory Visit (HOSPITAL_COMMUNITY): Payer: Self-pay

## 2021-08-11 ENCOUNTER — Other Ambulatory Visit (HOSPITAL_COMMUNITY): Payer: Self-pay

## 2021-08-14 ENCOUNTER — Other Ambulatory Visit (HOSPITAL_COMMUNITY): Payer: Self-pay

## 2021-08-17 ENCOUNTER — Other Ambulatory Visit (HOSPITAL_COMMUNITY): Payer: Self-pay

## 2021-08-25 ENCOUNTER — Other Ambulatory Visit (HOSPITAL_COMMUNITY): Payer: Self-pay

## 2021-09-07 ENCOUNTER — Other Ambulatory Visit: Payer: Self-pay | Admitting: Internal Medicine

## 2021-09-07 ENCOUNTER — Other Ambulatory Visit (HOSPITAL_COMMUNITY): Payer: Self-pay

## 2021-09-12 ENCOUNTER — Other Ambulatory Visit (HOSPITAL_COMMUNITY): Payer: Self-pay

## 2021-09-14 ENCOUNTER — Other Ambulatory Visit (HOSPITAL_COMMUNITY): Payer: Self-pay

## 2021-09-14 ENCOUNTER — Other Ambulatory Visit: Payer: Self-pay | Admitting: Pharmacist

## 2021-09-14 DIAGNOSIS — B20 Human immunodeficiency virus [HIV] disease: Secondary | ICD-10-CM

## 2021-09-14 MED ORDER — DOVATO 50-300 MG PO TABS
1.0000 | ORAL_TABLET | Freq: Every day | ORAL | 5 refills | Status: DC
  Filled 2021-09-14: qty 30, 30d supply, fill #0

## 2021-09-18 ENCOUNTER — Other Ambulatory Visit (HOSPITAL_COMMUNITY): Payer: Self-pay

## 2021-09-20 ENCOUNTER — Other Ambulatory Visit: Payer: Self-pay

## 2021-09-20 DIAGNOSIS — Z113 Encounter for screening for infections with a predominantly sexual mode of transmission: Secondary | ICD-10-CM

## 2021-09-20 DIAGNOSIS — Z79899 Other long term (current) drug therapy: Secondary | ICD-10-CM

## 2021-09-20 DIAGNOSIS — B20 Human immunodeficiency virus [HIV] disease: Secondary | ICD-10-CM

## 2021-09-21 ENCOUNTER — Other Ambulatory Visit: Payer: BC Managed Care – PPO

## 2021-09-21 ENCOUNTER — Other Ambulatory Visit: Payer: Self-pay

## 2021-09-21 DIAGNOSIS — B20 Human immunodeficiency virus [HIV] disease: Secondary | ICD-10-CM

## 2021-09-21 DIAGNOSIS — Z113 Encounter for screening for infections with a predominantly sexual mode of transmission: Secondary | ICD-10-CM

## 2021-09-21 DIAGNOSIS — Z79899 Other long term (current) drug therapy: Secondary | ICD-10-CM

## 2021-09-22 LAB — T-HELPER CELL (CD4) - (RCID CLINIC ONLY)
CD4 % Helper T Cell: 39 % (ref 33–65)
CD4 T Cell Abs: 1000 /uL (ref 400–1790)

## 2021-09-25 LAB — CBC WITH DIFFERENTIAL/PLATELET
Absolute Monocytes: 524 cells/uL (ref 200–950)
Basophils Absolute: 41 cells/uL (ref 0–200)
Basophils Relative: 0.6 %
Eosinophils Absolute: 269 cells/uL (ref 15–500)
Eosinophils Relative: 3.9 %
HCT: 46.7 % (ref 38.5–50.0)
Hemoglobin: 16.1 g/dL (ref 13.2–17.1)
Lymphs Abs: 2698 cells/uL (ref 850–3900)
MCH: 31.9 pg (ref 27.0–33.0)
MCHC: 34.5 g/dL (ref 32.0–36.0)
MCV: 92.5 fL (ref 80.0–100.0)
MPV: 10.4 fL (ref 7.5–12.5)
Monocytes Relative: 7.6 %
Neutro Abs: 3367 cells/uL (ref 1500–7800)
Neutrophils Relative %: 48.8 %
Platelets: 260 10*3/uL (ref 140–400)
RBC: 5.05 10*6/uL (ref 4.20–5.80)
RDW: 12.7 % (ref 11.0–15.0)
Total Lymphocyte: 39.1 %
WBC: 6.9 10*3/uL (ref 3.8–10.8)

## 2021-09-25 LAB — HIV-1 RNA QUANT-NO REFLEX-BLD
HIV 1 RNA Quant: NOT DETECTED Copies/mL
HIV-1 RNA Quant, Log: NOT DETECTED Log cps/mL

## 2021-09-25 LAB — COMPLETE METABOLIC PANEL WITH GFR
AG Ratio: 1.5 (calc) (ref 1.0–2.5)
ALT: 20 U/L (ref 9–46)
AST: 16 U/L (ref 10–40)
Albumin: 4.8 g/dL (ref 3.6–5.1)
Alkaline phosphatase (APISO): 83 U/L (ref 36–130)
BUN: 11 mg/dL (ref 7–25)
CO2: 27 mmol/L (ref 20–32)
Calcium: 9.7 mg/dL (ref 8.6–10.3)
Chloride: 103 mmol/L (ref 98–110)
Creat: 0.91 mg/dL (ref 0.60–1.24)
Globulin: 3.3 g/dL (calc) (ref 1.9–3.7)
Glucose, Bld: 91 mg/dL (ref 65–99)
Potassium: 3.9 mmol/L (ref 3.5–5.3)
Sodium: 138 mmol/L (ref 135–146)
Total Bilirubin: 0.5 mg/dL (ref 0.2–1.2)
Total Protein: 8.1 g/dL (ref 6.1–8.1)
eGFR: 118 mL/min/{1.73_m2} (ref >=60)

## 2021-09-25 LAB — LIPID PANEL
Cholesterol: 178 mg/dL (ref <200)
HDL: 30 mg/dL — ABNORMAL LOW (ref >=40)
LDL Cholesterol (Calc): 109 mg/dL (calc) — ABNORMAL HIGH
Non-HDL Cholesterol (Calc): 148 mg/dL (calc) — ABNORMAL HIGH (ref <130)
Total CHOL/HDL Ratio: 5.9 (calc) — ABNORMAL HIGH (ref <5.0)
Triglycerides: 270 mg/dL — ABNORMAL HIGH (ref <150)

## 2021-09-25 LAB — RPR TITER: RPR Titer: 1:2 {titer} — ABNORMAL HIGH

## 2021-09-25 LAB — FLUORESCENT TREPONEMAL AB(FTA)-IGG-BLD: Fluorescent Treponemal ABS: REACTIVE — AB

## 2021-09-25 LAB — RPR: RPR Ser Ql: REACTIVE — AB

## 2021-10-03 ENCOUNTER — Other Ambulatory Visit (HOSPITAL_COMMUNITY): Payer: Self-pay

## 2021-10-03 ENCOUNTER — Telehealth (INDEPENDENT_AMBULATORY_CARE_PROVIDER_SITE_OTHER): Payer: BC Managed Care – PPO | Admitting: Internal Medicine

## 2021-10-03 ENCOUNTER — Encounter: Payer: Self-pay | Admitting: Internal Medicine

## 2021-10-03 ENCOUNTER — Other Ambulatory Visit: Payer: Self-pay

## 2021-10-03 DIAGNOSIS — B20 Human immunodeficiency virus [HIV] disease: Secondary | ICD-10-CM | POA: Diagnosis not present

## 2021-10-03 MED ORDER — DOVATO 50-300 MG PO TABS
1.0000 | ORAL_TABLET | Freq: Every day | ORAL | 11 refills | Status: DC
  Filled 2021-10-03 – 2021-10-25 (×3): qty 30, 30d supply, fill #0
  Filled 2021-11-13: qty 30, 30d supply, fill #1
  Filled 2021-12-12 – 2022-01-02 (×2): qty 30, 30d supply, fill #2
  Filled 2022-01-22: qty 30, 30d supply, fill #3
  Filled 2022-02-21: qty 30, 30d supply, fill #4
  Filled 2022-03-15: qty 30, 30d supply, fill #5
  Filled 2022-04-20 – 2022-05-04 (×2): qty 30, 30d supply, fill #6
  Filled 2022-05-28: qty 30, 30d supply, fill #7
  Filled 2022-06-26: qty 30, 30d supply, fill #8
  Filled 2022-07-24 – 2022-09-04 (×4): qty 30, 30d supply, fill #9
  Filled 2022-09-24: qty 30, 30d supply, fill #10

## 2021-10-03 NOTE — Progress Notes (Signed)
° °  Subjective:    Patient ID: MARQUAN VOKES, male    DOB: 1990-12-02, 31 y.o.   MRN: [id number]  I connected with  JOUD INGWERSEN on 10/03/21 by a video enabled telemedicine application and verified that I am speaking with the correct person using two identifiers.   I discussed the limitations of evaluation and management by telemedicine. The patient expressed understanding and agreed to proceed.  Location: Patient - home Physician - clinic  Duration of visit:  13 minutes  HPI Demarie is connected for follow up of HIV He is on Dovato and denies any missed doses.  CD4 1000 and viral load not detected.  No complaints.  No issues with getting, taking or tolerating the medication, particularly since getting the copay card.  He has no concerns today.  Has not had a flu shot yet.     Review of Systems  Constitutional:  Negative for fatigue and unexpected weight change.  Gastrointestinal:  Negative for diarrhea and nausea.  Skin:  Negative for rash.      Objective:   Physical Exam Eyes:     General: No scleral icterus. Pulmonary:     Effort: Pulmonary effort is normal.  Skin:    Findings: No rash.  Neurological:     General: No focal deficit present.     Mental Status: He is alert.  Psychiatric:        Mood and Affect: Mood normal.   SH: no tobacco       Assessment & Plan:

## 2021-10-03 NOTE — Assessment & Plan Note (Signed)
He continues to do well with no concerns on Dovato and no changes indicated.  I advised him to get a flu shot. Otherwise he can return in 6 months.

## 2021-10-11 ENCOUNTER — Other Ambulatory Visit (HOSPITAL_COMMUNITY): Payer: Self-pay

## 2021-10-13 ENCOUNTER — Other Ambulatory Visit (HOSPITAL_COMMUNITY): Payer: Self-pay

## 2021-10-23 ENCOUNTER — Other Ambulatory Visit (HOSPITAL_COMMUNITY): Payer: Self-pay

## 2021-10-25 ENCOUNTER — Other Ambulatory Visit (HOSPITAL_COMMUNITY): Payer: Self-pay

## 2021-11-13 ENCOUNTER — Other Ambulatory Visit (HOSPITAL_COMMUNITY): Payer: Self-pay

## 2021-11-17 ENCOUNTER — Other Ambulatory Visit (HOSPITAL_COMMUNITY): Payer: Self-pay

## 2021-12-11 ENCOUNTER — Telehealth: Payer: Self-pay

## 2021-12-11 NOTE — Telephone Encounter (Signed)
Was following up with JW on his Dovato. Discussed his adherence- denies missing doses, and adverse effects- denies any issues. During the discussion he asked questions about the Cabenuva injection.  ? ?Counseled that Renaldo Harrison is two separate intramuscular injections in the gluteal muscle on each side for each visit. Explained that the second injection is 30 days after the initial injection then every 2 months thereafter. Discussed the need for viral load monitoring every 2 months for the first 6 months and then periodically afterwards as their provider sees the need. Discussed the rare but significant chance of developing resistance despite compliance. Explained that showing up to injection appointments is very important and warned that if 2 appointments are missed, it will be reassessed by their provider whether they are a good candidate for injection therapy. Counseled on possible side effects associated with the injections such as injection site pain, which is usually mild to moderate in nature, injection site nodules, and injection site reactions.  ? ?Discussed how if he is interested he should reach out to Dr.Comer about interest. Additionally, how it may take some time to get insurance/PA through. He was amenable and said he would reach out. ?

## 2021-12-12 ENCOUNTER — Other Ambulatory Visit (HOSPITAL_COMMUNITY): Payer: Self-pay

## 2021-12-18 ENCOUNTER — Other Ambulatory Visit (HOSPITAL_COMMUNITY): Payer: Self-pay

## 2021-12-26 ENCOUNTER — Other Ambulatory Visit (HOSPITAL_COMMUNITY): Payer: Self-pay

## 2022-01-02 ENCOUNTER — Other Ambulatory Visit (HOSPITAL_COMMUNITY): Payer: Self-pay

## 2022-01-18 ENCOUNTER — Other Ambulatory Visit (HOSPITAL_COMMUNITY): Payer: Self-pay

## 2022-01-22 ENCOUNTER — Other Ambulatory Visit (HOSPITAL_COMMUNITY): Payer: Self-pay

## 2022-01-25 ENCOUNTER — Other Ambulatory Visit (HOSPITAL_COMMUNITY): Payer: Self-pay

## 2022-02-21 ENCOUNTER — Other Ambulatory Visit (HOSPITAL_COMMUNITY): Payer: Self-pay

## 2022-03-15 ENCOUNTER — Other Ambulatory Visit (HOSPITAL_COMMUNITY): Payer: Self-pay

## 2022-03-19 ENCOUNTER — Other Ambulatory Visit (HOSPITAL_COMMUNITY): Payer: Self-pay

## 2022-03-30 ENCOUNTER — Other Ambulatory Visit (HOSPITAL_COMMUNITY): Payer: Self-pay

## 2022-04-10 ENCOUNTER — Other Ambulatory Visit (HOSPITAL_COMMUNITY): Payer: Self-pay

## 2022-04-20 ENCOUNTER — Other Ambulatory Visit (HOSPITAL_COMMUNITY): Payer: Self-pay

## 2022-04-23 ENCOUNTER — Other Ambulatory Visit (HOSPITAL_COMMUNITY): Payer: Self-pay

## 2022-05-04 ENCOUNTER — Other Ambulatory Visit (HOSPITAL_COMMUNITY): Payer: Self-pay

## 2022-05-23 ENCOUNTER — Other Ambulatory Visit (HOSPITAL_COMMUNITY): Payer: Self-pay

## 2022-05-24 ENCOUNTER — Other Ambulatory Visit (HOSPITAL_COMMUNITY): Payer: Self-pay

## 2022-05-28 ENCOUNTER — Other Ambulatory Visit (HOSPITAL_COMMUNITY): Payer: Self-pay

## 2022-05-30 ENCOUNTER — Emergency Department (HOSPITAL_BASED_OUTPATIENT_CLINIC_OR_DEPARTMENT_OTHER): Payer: BC Managed Care – PPO | Admitting: Radiology

## 2022-05-30 ENCOUNTER — Emergency Department (HOSPITAL_BASED_OUTPATIENT_CLINIC_OR_DEPARTMENT_OTHER)
Admission: EM | Admit: 2022-05-30 | Discharge: 2022-05-31 | Disposition: A | Discharge status: Home / Self Care | Payer: BC Managed Care – PPO | Attending: Emergency Medicine | Admitting: Emergency Medicine

## 2022-05-30 ENCOUNTER — Encounter (HOSPITAL_BASED_OUTPATIENT_CLINIC_OR_DEPARTMENT_OTHER): Payer: Self-pay

## 2022-05-30 DIAGNOSIS — R112 Nausea with vomiting, unspecified: Secondary | ICD-10-CM | POA: Diagnosis not present

## 2022-05-30 DIAGNOSIS — R059 Cough, unspecified: Secondary | ICD-10-CM | POA: Diagnosis present

## 2022-05-30 DIAGNOSIS — J45909 Unspecified asthma, uncomplicated: Secondary | ICD-10-CM | POA: Insufficient documentation

## 2022-05-30 DIAGNOSIS — Z21 Asymptomatic human immunodeficiency virus [HIV] infection status: Secondary | ICD-10-CM | POA: Diagnosis not present

## 2022-05-30 DIAGNOSIS — J209 Acute bronchitis, unspecified: Secondary | ICD-10-CM | POA: Diagnosis not present

## 2022-05-30 DIAGNOSIS — Z20822 Contact with and (suspected) exposure to covid-19: Secondary | ICD-10-CM | POA: Diagnosis not present

## 2022-05-30 LAB — SARS CORONAVIRUS 2 BY RT PCR: SARS Coronavirus 2 by RT PCR: NEGATIVE

## 2022-05-30 LAB — RESP PANEL BY RT-PCR (FLU A&B, COVID) ARPGX2
Influenza A by PCR: NEGATIVE
Influenza B by PCR: NEGATIVE
SARS Coronavirus 2 by RT PCR: NEGATIVE

## 2022-05-30 NOTE — ED Triage Notes (Signed)
Pt. Jesse Smith, ambulatory, cc FLS, congestion, cough, NV. States poss. Strep.

## 2022-05-30 NOTE — ED Provider Notes (Signed)
[facility] Emergency Department Provider Note MRN:  [id number]  Arrival date & time: 05/31/22     Chief Complaint   Cough History of Present Illness   Jesse Smith is a 31 y.o. year-old male with a history of asthma presenting to the ED with chief complaint of cough.  Persistent cough, cold-like symptoms, subjective fever, nausea vomiting.  First got sick about 1 month ago, felt better for a while and then got worse again.  Denies chest pain, no abdominal pain, no rash, no tick bites.  Persistent cough and fevers.  Review of Systems  A thorough review of systems was obtained and all systems are negative except as noted in the HPI and PMH.   Patient's Health History    Past Medical History:  Diagnosis Date   Asthma     Past Surgical History:  Procedure Laterality Date   TONSILLECTOMY      History reviewed. No pertinent family history.  Social History   Socioeconomic History   Marital status: Single    Spouse name: Not on file   Number of children: Not on file   Years of education: Not on file   Highest education level: Not on file  Occupational History   Not on file  Tobacco Use   Smoking status: Never   Smokeless tobacco: Never  Substance and Sexual Activity   Alcohol use: Yes    Comment: occasional   Drug use: No   Sexual activity: Not Currently    Partners: Male    Birth control/protection: Condom    Comment: declined  CONDOM[place] 09/22/20  Other Topics Concern   Not on file  Social History Narrative   Not on file   Social Determinants of Health   Financial Resource Strain: Not on file  Food Insecurity: Not on file  Transportation Needs: Not on file  Physical Activity: Not on file  Stress: Not on file  Social Connections: Not on file  Intimate Partner Violence: Not on file     Physical Exam   Vitals:   05/30/22 2300 05/31/22 0000  BP: 117/74 (!) 116/97  Pulse: 86 81  Resp: 18 18  Temp:    SpO2: 95% 93%     CONSTITUTIONAL: Well-appearing, NAD NEURO/PSYCH:  Alert and oriented x 3, no focal deficits EYES:  eyes equal and reactive ENT/NECK:  no LAD, no JVD CARDIO: Regular rate, well-perfused, normal S1 and S2 PULM:  CTAB no wheezing or rhonchi GI/GU:  non-distended, non-tender MSK/SPINE:  No gross deformities, no edema SKIN:  no rash, atraumatic   *Additional and/or pertinent findings included in MDM below  Diagnostic and Interventional Summary    EKG Interpretation  Date/Time:    Ventricular Rate:    PR Interval:    QRS Duration:   QT Interval:    QTC Calculation:   R Axis:     Text Interpretation:         Labs Reviewed  SARS CORONAVIRUS 2 BY RT PCR  RESP PANEL BY RT-PCR (FLU A&B, COVID) [id number]    DG Chest 2 View  Final Result      Medications  doxycycline (VIBRA-TABS) tablet 100 mg (has no administration in time range)     Procedures  /  Critical Care Procedures  ED Course and Medical Decision Making  Initial Impression and Ddx Suspicious for continued viral illness or secondary bacterial pneumonia.  No meningismus, benign abdomen.  COVID and flu tests negative.  History of HIV, compliant on his  meds.  No dysuria.  Awaiting chest x-ray.  Past medical/surgical history that increases complexity of ED encounter: HIV  Interpretation of Diagnostics I personally reviewed the chest x-ray and my interpretation is as follows: No obvious pneumothorax or lobar opacity, radiology commenting on a bronchitis type pattern    Patient Reassessment and Ultimate Disposition/Management     Given the history and patient's HIV status and duration of illness, will cover with antibiotics, continues to have normal vital signs without any increased work of breathing, appropriate for discharge.  Patient management required discussion with the following services or consulting groups:  None  Complexity of Problems Addressed Acute illness or injury that poses threat of life of bodily  function  Additional Data Reviewed and Analyzed Further history obtained from: Further history from spouse/family member  Additional Factors Impacting ED Encounter Risk Prescriptions  Elmer Sow. Pilar Plate, MD [facility] Emergency Medicine [facility] [email or web address]  Final Clinical Impressions(s) / ED Diagnoses     ICD-10-CM   1. Acute bronchitis, unspecified organism  J20.9       ED Discharge Orders          Ordered    doxycycline (VIBRAMYCIN) 100 MG capsule  2 times daily        05/31/22 0031             Discharge Instructions Discussed with and Provided to Patient:     Discharge Instructions      You were evaluated in the Emergency Department and after careful evaluation, we did not find any emergent condition requiring admission or further testing in the hospital.  Your exam/testing today is overall reassuring.  Symptoms may be due to a pneumonia or bacterial bronchitis.  Recommend taking the doxycycline twice daily as directed.  Continue plenty fluids, rest, Tylenol or Motrin for pain or discomfort.  Please return to the Emergency Department if you experience any worsening of your condition.   Thank you for allowing [place] to be a part of your care.       Sabas Sous, MD 05/31/22 [phone]

## 2022-05-31 MED ORDER — DOXYCYCLINE HYCLATE 100 MG PO CAPS: 100.0000 mg | ORAL_CAPSULE | Freq: Two times a day (BID) | ORAL | 0 refills | Status: AC

## 2022-05-31 MED ORDER — DOXYCYCLINE HYCLATE 100 MG PO TABS
100.0000 mg | ORAL_TABLET | Freq: Once | ORAL | Status: AC
  Administered 2022-05-31: 100 mg via ORAL
  Filled 2022-05-31: qty 1

## 2022-05-31 NOTE — Discharge Instructions (Signed)
You were evaluated in the Emergency Department and after careful evaluation, we did not find any emergent condition requiring admission or further testing in the hospital.  Your exam/testing today is overall reassuring.  Symptoms may be due to a pneumonia or bacterial bronchitis.  Recommend taking the doxycycline twice daily as directed.  Continue plenty fluids, rest, Tylenol or Motrin for pain or discomfort.  Please return to the Emergency Department if you experience any worsening of your condition.   Thank you for allowing [place] to be a part of your care.

## 2022-06-01 ENCOUNTER — Other Ambulatory Visit (HOSPITAL_COMMUNITY): Payer: Self-pay

## 2022-06-21 ENCOUNTER — Other Ambulatory Visit (HOSPITAL_COMMUNITY): Payer: Self-pay

## 2022-06-26 ENCOUNTER — Other Ambulatory Visit (HOSPITAL_COMMUNITY): Payer: Self-pay

## 2022-07-03 ENCOUNTER — Other Ambulatory Visit (HOSPITAL_COMMUNITY): Payer: Self-pay

## 2022-07-23 ENCOUNTER — Other Ambulatory Visit (HOSPITAL_COMMUNITY): Payer: Self-pay

## 2022-07-24 ENCOUNTER — Other Ambulatory Visit (HOSPITAL_COMMUNITY): Payer: Self-pay

## 2022-07-30 ENCOUNTER — Other Ambulatory Visit (HOSPITAL_COMMUNITY): Payer: Self-pay

## 2022-08-04 ENCOUNTER — Other Ambulatory Visit (HOSPITAL_COMMUNITY): Payer: Self-pay

## 2022-08-06 ENCOUNTER — Other Ambulatory Visit (HOSPITAL_COMMUNITY): Payer: Self-pay

## 2022-08-14 ENCOUNTER — Other Ambulatory Visit: Payer: Self-pay | Admitting: Pharmacist

## 2022-08-14 DIAGNOSIS — B20 Human immunodeficiency virus [HIV] disease: Secondary | ICD-10-CM

## 2022-08-14 MED ORDER — DOVATO 50-300 MG PO TABS: 1.0000 | ORAL_TABLET | Freq: Every day | ORAL | 0 refills | Status: AC

## 2022-08-14 NOTE — Progress Notes (Signed)
Medication Samples have been provided to the patient.  Drug name: Dovato        Strength: 50/300 mg         Qty: 2 bottles (28 tablets)   LOT: [id number]   Exp.Date: 07/2023  Dosing instructions: Take one tablet by mouth once daily  The patient has been instructed regarding the correct time, dose, and frequency of taking this medication, including desired effects and most common side effects.   Sequoyah Ramone L. Jannette Fogo, PharmD, BCIDP, AAHIVP, CPP Clinical Pharmacist Practitioner Infectious Diseases Clinical Pharmacist [facility] 08/29/2020, 10:07 AM

## 2022-08-16 ENCOUNTER — Other Ambulatory Visit (HOSPITAL_COMMUNITY): Payer: Self-pay

## 2022-08-24 ENCOUNTER — Other Ambulatory Visit (HOSPITAL_COMMUNITY): Payer: Self-pay

## 2022-08-24 ENCOUNTER — Other Ambulatory Visit: Payer: Self-pay

## 2022-09-01 ENCOUNTER — Other Ambulatory Visit (HOSPITAL_COMMUNITY): Payer: Self-pay

## 2022-09-04 ENCOUNTER — Other Ambulatory Visit (HOSPITAL_COMMUNITY): Payer: Self-pay

## 2022-09-13 ENCOUNTER — Other Ambulatory Visit (HOSPITAL_COMMUNITY): Payer: Self-pay

## 2022-09-21 ENCOUNTER — Other Ambulatory Visit (HOSPITAL_COMMUNITY): Payer: Self-pay

## 2022-09-24 ENCOUNTER — Other Ambulatory Visit (HOSPITAL_COMMUNITY): Payer: Self-pay

## 2022-10-01 ENCOUNTER — Other Ambulatory Visit: Payer: Self-pay

## 2022-10-01 ENCOUNTER — Other Ambulatory Visit (HOSPITAL_COMMUNITY): Payer: Self-pay

## 2022-10-23 ENCOUNTER — Other Ambulatory Visit (HOSPITAL_COMMUNITY): Payer: Self-pay

## 2022-10-25 ENCOUNTER — Other Ambulatory Visit (HOSPITAL_COMMUNITY): Payer: Self-pay

## 2022-10-25 ENCOUNTER — Other Ambulatory Visit: Payer: Self-pay

## 2022-10-25 ENCOUNTER — Other Ambulatory Visit: Payer: Self-pay | Admitting: Internal Medicine

## 2022-10-25 DIAGNOSIS — B20 Human immunodeficiency virus [HIV] disease: Secondary | ICD-10-CM

## 2022-10-25 MED ORDER — DOVATO 50-300 MG PO TABS
1.0000 | ORAL_TABLET | Freq: Every day | ORAL | 0 refills | Status: DC
  Filled 2022-10-25: qty 30, 30d supply, fill #0

## 2022-11-05 ENCOUNTER — Other Ambulatory Visit (HOSPITAL_COMMUNITY): Payer: Self-pay

## 2022-11-15 ENCOUNTER — Ambulatory Visit: Payer: BC Managed Care – PPO | Admitting: Internal Medicine

## 2022-11-22 ENCOUNTER — Other Ambulatory Visit (HOSPITAL_COMMUNITY): Payer: Self-pay

## 2022-11-23 ENCOUNTER — Other Ambulatory Visit: Payer: Self-pay | Admitting: Internal Medicine

## 2022-11-23 ENCOUNTER — Other Ambulatory Visit: Payer: Self-pay

## 2022-11-23 ENCOUNTER — Other Ambulatory Visit (HOSPITAL_COMMUNITY): Payer: Self-pay

## 2022-11-23 DIAGNOSIS — B20 Human immunodeficiency virus [HIV] disease: Secondary | ICD-10-CM

## 2022-11-23 MED ORDER — DOVATO 50-300 MG PO TABS
1.0000 | ORAL_TABLET | Freq: Every day | ORAL | 0 refills | Status: DC
  Filled 2022-11-23 (×2): qty 30, 30d supply, fill #0

## 2022-11-27 ENCOUNTER — Telehealth: Payer: Self-pay

## 2022-11-27 ENCOUNTER — Ambulatory Visit (INDEPENDENT_AMBULATORY_CARE_PROVIDER_SITE_OTHER): Payer: BC Managed Care – PPO | Admitting: Internal Medicine

## 2022-11-27 ENCOUNTER — Other Ambulatory Visit (HOSPITAL_COMMUNITY)
Admission: RE | Admit: 2022-11-27 | Discharge: 2022-11-27 | Disposition: A | Discharge status: Home / Self Care | Payer: BC Managed Care – PPO | Source: Ambulatory Visit | Attending: Internal Medicine | Admitting: Internal Medicine

## 2022-11-27 ENCOUNTER — Other Ambulatory Visit: Payer: Self-pay

## 2022-11-27 ENCOUNTER — Encounter: Payer: Self-pay | Admitting: Internal Medicine

## 2022-11-27 VITALS — BP 145/78 | HR 70 | Temp 97.7°F | Resp 16 | Ht 72.0 in | Wt 199.6 lb

## 2022-11-27 DIAGNOSIS — B20 Human immunodeficiency virus [HIV] disease: Secondary | ICD-10-CM

## 2022-11-27 DIAGNOSIS — Z79899 Other long term (current) drug therapy: Secondary | ICD-10-CM | POA: Diagnosis not present

## 2022-11-27 DIAGNOSIS — Z113 Encounter for screening for infections with a predominantly sexual mode of transmission: Secondary | ICD-10-CM

## 2022-11-27 NOTE — Assessment & Plan Note (Signed)
Will check hs lipid panel

## 2022-11-27 NOTE — Telephone Encounter (Signed)
Counseled that [place] is two separate intramuscular injections in the gluteal muscle on each side for each visit. Explained that the second injection is 30 days after the initial injection then every 2 months thereafter.  Explained that showing up to injection appointments is very important regarding the two-week treatment window and patient reported understanding. Counseled on possible side effects associated with the injections such as injection site pain, which is usually mild to moderate in nature, injection site nodules, and injection site reactions. Patient is aware that the medication will require a PA through his insurance and is understands that it may take a couple of weeks for this to be completed.

## 2022-11-27 NOTE — Assessment & Plan Note (Signed)
Will screen 

## 2022-11-27 NOTE — Progress Notes (Signed)
   Subjective:    Patient ID: Jesse Smith, male    DOB: [birth year], 32 y.o.   MRN: [id number]  HPI Jesse Smith is here for follow up of HIV He was last seen one year ago and no issues since that time.  No complaints today.  Interested in Jesse Smith.     Review of Systems  Constitutional:  Negative for fatigue.  Gastrointestinal:  Negative for diarrhea and nausea.  Skin:  Negative for rash.       Objective:   Physical Exam Eyes:     General: No scleral icterus. Pulmonary:     Effort: Pulmonary effort is normal.  Skin:    Findings: No rash.  Neurological:     Mental Status: He is alert.   SH: no tobacco        Assessment & Plan:

## 2022-11-27 NOTE — Assessment & Plan Note (Signed)
Doing well, no concerns.  Cabenvua discussed and he is interested.  Seen by pharmacy and will check with his insurance and start, if possible.

## 2022-11-28 ENCOUNTER — Telehealth: Payer: Self-pay

## 2022-11-28 ENCOUNTER — Other Ambulatory Visit (HOSPITAL_COMMUNITY): Payer: Self-pay

## 2022-11-28 ENCOUNTER — Other Ambulatory Visit: Payer: Self-pay

## 2022-11-28 ENCOUNTER — Other Ambulatory Visit: Payer: Self-pay | Admitting: Pharmacist

## 2022-11-28 DIAGNOSIS — B20 Human immunodeficiency virus [HIV] disease: Secondary | ICD-10-CM

## 2022-11-28 LAB — T-HELPER CELL (CD4) - (RCID CLINIC ONLY)
CD4 % Helper T Cell: 40 % (ref 33–65)
CD4 T Cell Abs: 864 /uL (ref 400–1790)

## 2022-11-28 MED ORDER — CABOTEGRAVIR & RILPIVIRINE ER 600 & 900 MG/3ML IM SUER
1.0000 | INTRAMUSCULAR | 5 refills | Status: DC
  Filled 2022-11-28: qty 6, 60d supply, fill #0
  Filled 2023-03-04: qty 6, 30d supply, fill #0
  Filled 2023-05-09: qty 6, 30d supply, fill #1
  Filled 2023-07-08: qty 6, 30d supply, fill #2
  Filled 2023-09-03: qty 6, 30d supply, fill #3
  Filled 2023-10-31: qty 6, 30d supply, fill #4

## 2022-11-28 MED ORDER — CABOTEGRAVIR & RILPIVIRINE ER 600 & 900 MG/3ML IM SUER
1.0000 | INTRAMUSCULAR | 1 refills | Status: DC
  Filled 2022-11-28 – 2022-12-20 (×2): qty 6, 30d supply, fill #0
  Filled 2023-01-11: qty 6, 30d supply, fill #1

## 2022-11-28 NOTE — Telephone Encounter (Signed)
[facility] Patient Advocate Encounter   Received notification from [employer] that prior authorization for Kern Reap is required.   PA submitted on 11/28/22 Key [id number] Status is pending    [facility] will continue to follow.   Ileene Patrick, [place] [facility] Patient [facility] Phone: [phone] Fax:  [phone]

## 2022-11-28 NOTE — Telephone Encounter (Signed)
[facility] Patient Advocate Encounter  Prior Authorization for Kern Reap has been approved.    PA# [id number]  Effective dates: 11/28/22 through 11/28/23  Patients co-pay is $.0.00.   Prescription can be filled at [facility].  [facility] will continue to follow.  Ileene Patrick, [place] [facility] Patient [facility] for Infectious Disease Phone: [phone] Fax:  [phone]

## 2022-11-29 ENCOUNTER — Other Ambulatory Visit (HOSPITAL_COMMUNITY): Payer: Self-pay

## 2022-11-29 LAB — CYTOLOGY, (ORAL, ANAL, URETHRAL) ANCILLARY ONLY
Chlamydia: NEGATIVE
Chlamydia: NEGATIVE
Comment: NEGATIVE
Comment: NEGATIVE
Comment: NORMAL
Comment: NORMAL
Neisseria Gonorrhea: NEGATIVE
Neisseria Gonorrhea: NEGATIVE

## 2022-11-29 LAB — URINE CYTOLOGY ANCILLARY ONLY
Chlamydia: NEGATIVE
Comment: NEGATIVE
Comment: NORMAL
Neisseria Gonorrhea: NEGATIVE

## 2022-11-30 ENCOUNTER — Other Ambulatory Visit (HOSPITAL_COMMUNITY): Payer: Self-pay

## 2022-11-30 LAB — CBC WITH DIFFERENTIAL/PLATELET
Absolute Monocytes: 561 cells/uL (ref 200–950)
Basophils Absolute: 28 cells/uL (ref 0–200)
Basophils Relative: 0.4 %
Eosinophils Absolute: 142 cells/uL (ref 15–500)
Eosinophils Relative: 2 %
HCT: 46.7 % (ref 38.5–50.0)
Hemoglobin: 16.4 g/dL (ref 13.2–17.1)
Lymphs Abs: 2265 cells/uL (ref 850–3900)
MCH: 32.7 pg (ref 27.0–33.0)
MCHC: 35.1 g/dL (ref 32.0–36.0)
MCV: 93.2 fL (ref 80.0–100.0)
MPV: 11.1 fL (ref 7.5–12.5)
Monocytes Relative: 7.9 %
Neutro Abs: 4104 cells/uL (ref 1500–7800)
Neutrophils Relative %: 57.8 %
Platelets: 279 10*3/uL (ref 140–400)
RBC: 5.01 10*6/uL (ref 4.20–5.80)
RDW: 12.5 % (ref 11.0–15.0)
Total Lymphocyte: 31.9 %
WBC: 7.1 10*3/uL (ref 3.8–10.8)

## 2022-11-30 LAB — COMPLETE METABOLIC PANEL WITH GFR
AG Ratio: 1.5 (calc) (ref 1.0–2.5)
ALT: 33 U/L (ref 9–46)
AST: 18 U/L (ref 10–40)
Albumin: 4.8 g/dL (ref 3.6–5.1)
Alkaline phosphatase (APISO): 80 U/L (ref 36–130)
BUN/Creatinine Ratio: 9 (calc) (ref 6–22)
BUN: 11 mg/dL (ref 7–25)
CO2: 23 mmol/L (ref 20–32)
Calcium: 9.3 mg/dL (ref 8.6–10.3)
Chloride: 103 mmol/L (ref 98–110)
Creat: 1.28 mg/dL — ABNORMAL HIGH (ref 0.60–1.24)
Globulin: 3.3 g/dL (calc) (ref 1.9–3.7)
Glucose, Bld: 78 mg/dL (ref 65–99)
Potassium: 4 mmol/L (ref 3.5–5.3)
Sodium: 139 mmol/L (ref 135–146)
Total Bilirubin: 0.6 mg/dL (ref 0.2–1.2)
Total Protein: 8.1 g/dL (ref 6.1–8.1)
eGFR: 78 mL/min/{1.73_m2} (ref >=60)

## 2022-11-30 LAB — LIPID PANEL
Cholesterol: 209 mg/dL — ABNORMAL HIGH (ref <200)
HDL: 32 mg/dL — ABNORMAL LOW (ref >=40)
LDL Cholesterol (Calc): 127 mg/dL (calc) — ABNORMAL HIGH
Non-HDL Cholesterol (Calc): 177 mg/dL (calc) — ABNORMAL HIGH (ref <130)
Total CHOL/HDL Ratio: 6.5 (calc) — ABNORMAL HIGH (ref <5.0)
Triglycerides: 352 mg/dL — ABNORMAL HIGH (ref <150)

## 2022-11-30 LAB — HIV-1 RNA QUANT-NO REFLEX-BLD
HIV 1 RNA Quant: NOT DETECTED Copies/mL
HIV-1 RNA Quant, Log: NOT DETECTED Log cps/mL

## 2022-11-30 LAB — RPR: RPR Ser Ql: NONREACTIVE

## 2022-12-04 ENCOUNTER — Other Ambulatory Visit (HOSPITAL_COMMUNITY): Payer: Self-pay

## 2022-12-10 ENCOUNTER — Other Ambulatory Visit (HOSPITAL_COMMUNITY): Payer: Self-pay

## 2022-12-20 ENCOUNTER — Other Ambulatory Visit (HOSPITAL_COMMUNITY): Payer: Self-pay

## 2022-12-20 ENCOUNTER — Other Ambulatory Visit: Payer: Self-pay

## 2022-12-21 ENCOUNTER — Other Ambulatory Visit: Payer: Self-pay

## 2022-12-21 ENCOUNTER — Other Ambulatory Visit (HOSPITAL_COMMUNITY): Payer: Self-pay

## 2022-12-21 IMAGING — CT CT CHEST SUPER D W/O CM
2 of 5 series · 15 of 36 positions shown, 18 images · non-contrast
Comparison: No prior images.

CLINICAL DATA: Pulmonary nodule.

EXAM:
CT CHEST WITHOUT CONTRAST
TECHNIQUE: Multidetector CT imaging of the chest was performed using thin slice
collimation for electromagnetic bronchoscopy planning purposes,
without intravenous contrast.

[Series 4: thins · axial · 0.66mm/px · z∈[-321,-77]mm · 12 of 353 slices shown, 15 images]
[im 24/353  mediastinal]
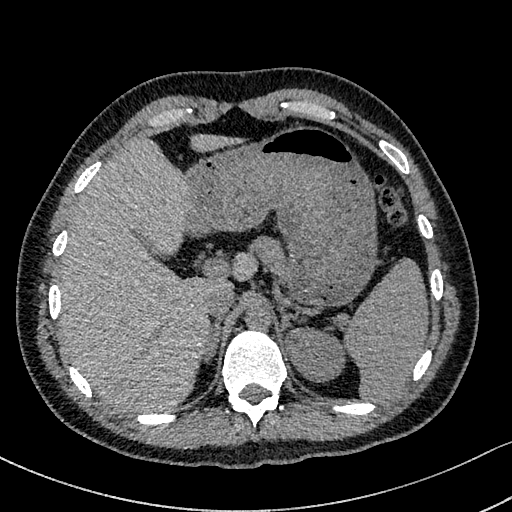
[im 24/353  lung]
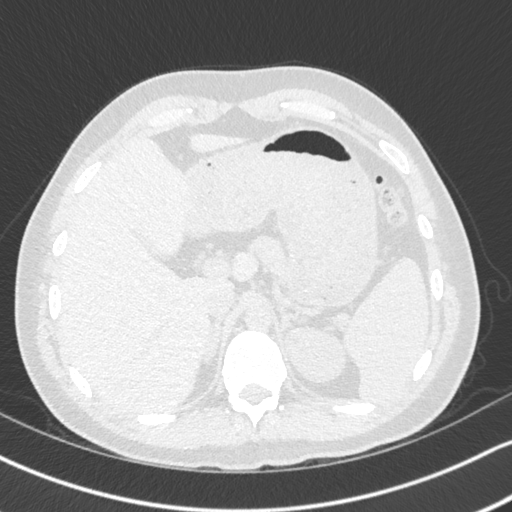
[im 47/353  lung]
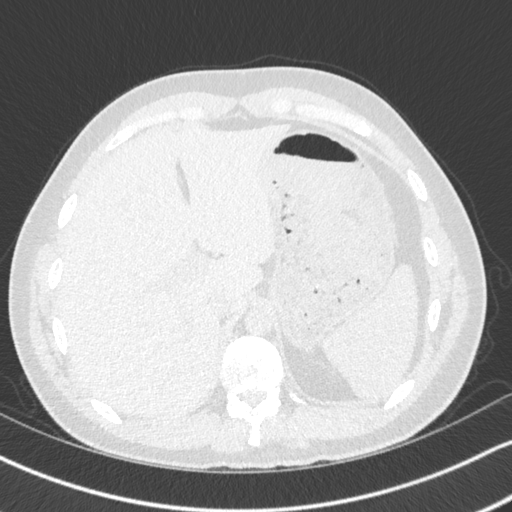
[im 71/353  lung]
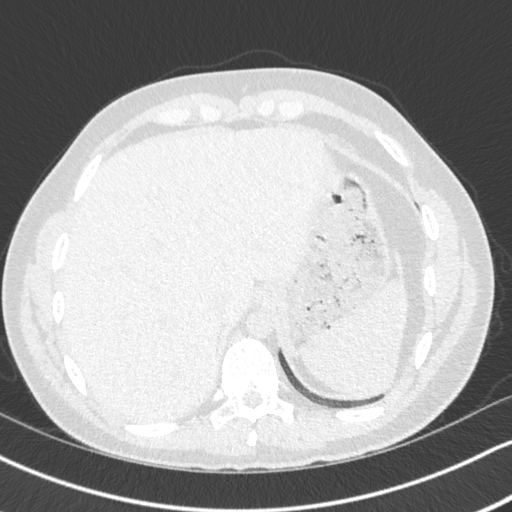
[im 118/353  lung]
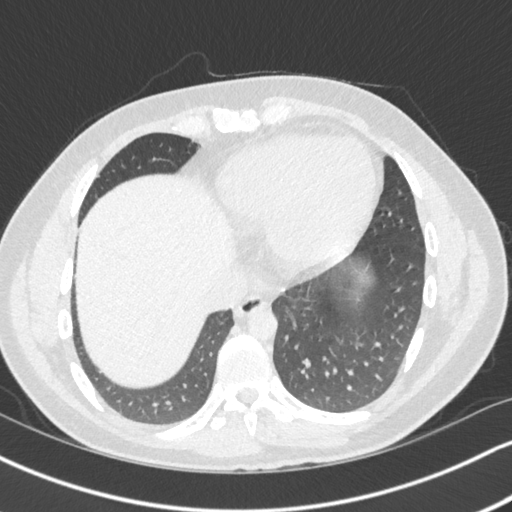
[im 141/353  mediastinal]
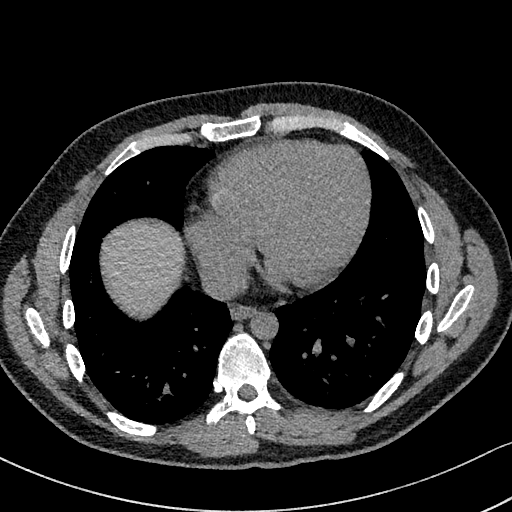
[im 141/353  lung]
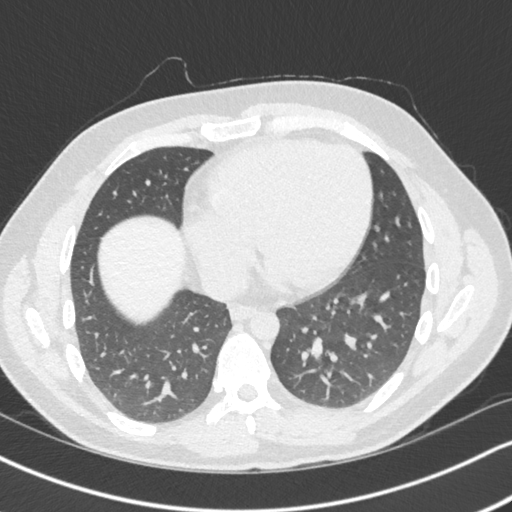
[im 165/353  lung]
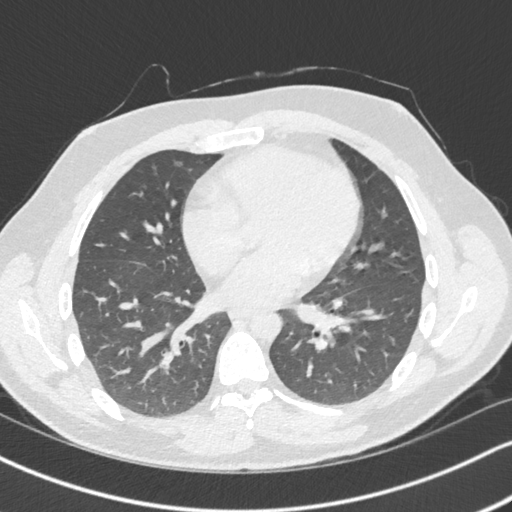
[im 188/353  lung]
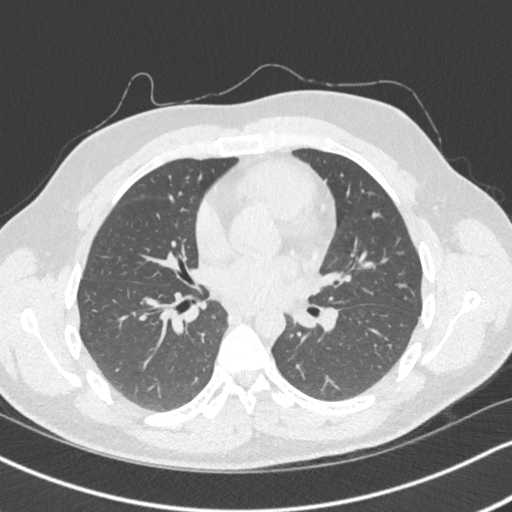
[im 212/353  lung]
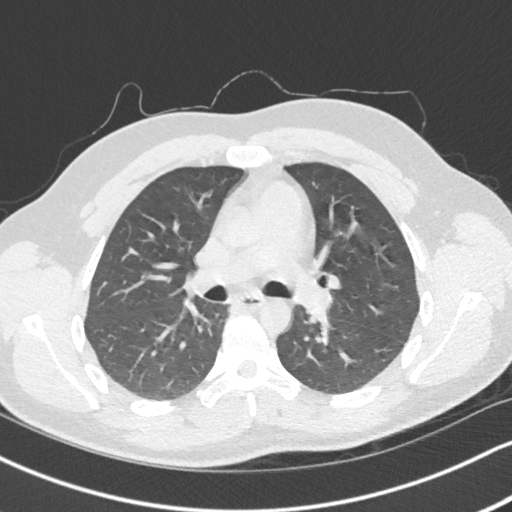
[im 235/353  mediastinal]
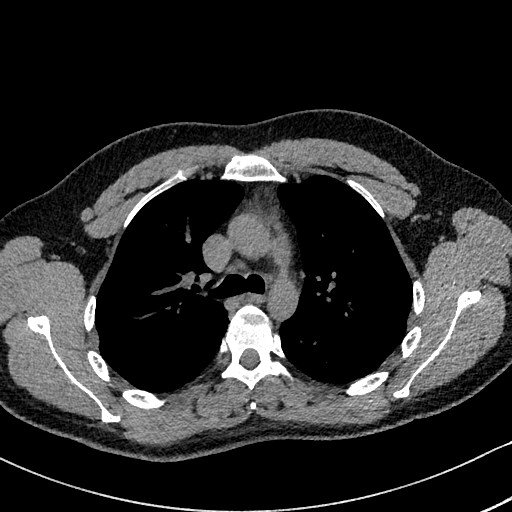
[im 235/353  lung]
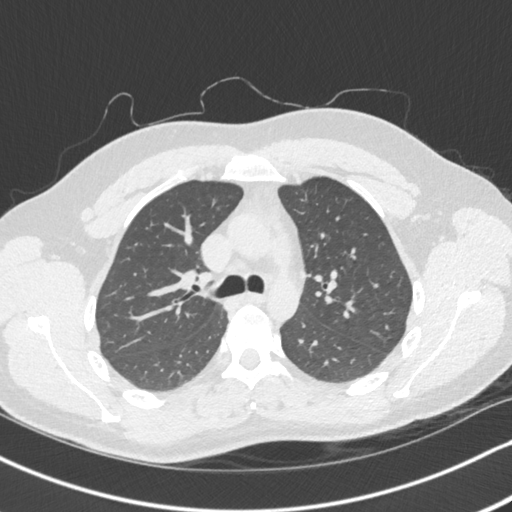
[im 282/353  lung]
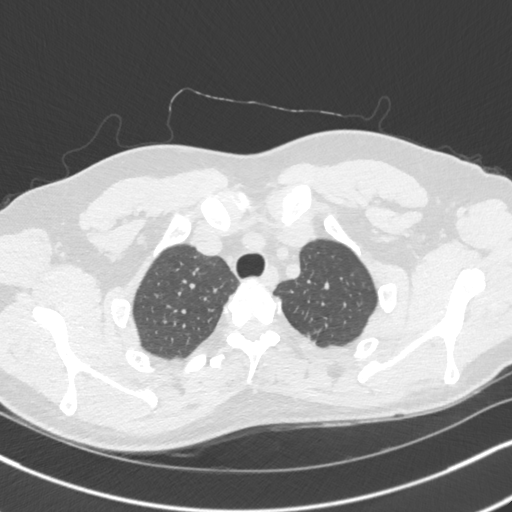
[im 306/353  lung]
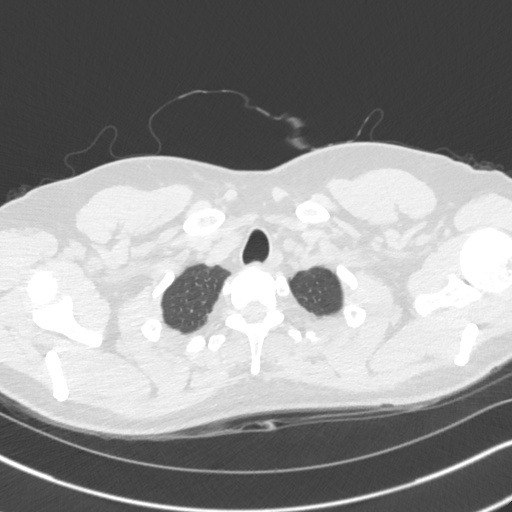
[im 329/353  lung]
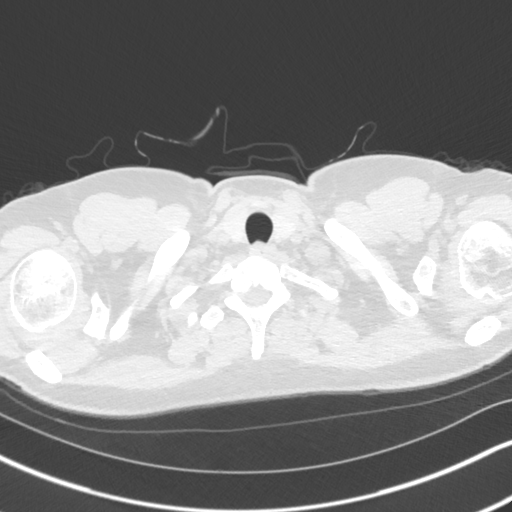

[Series 5: coronal · coronal · 0.59mm/px · 3 of 79 slices shown]
[im 16/79  lung]
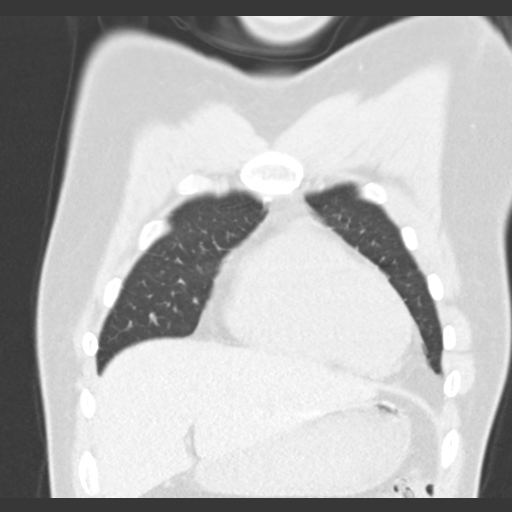
[im 32/79  lung]
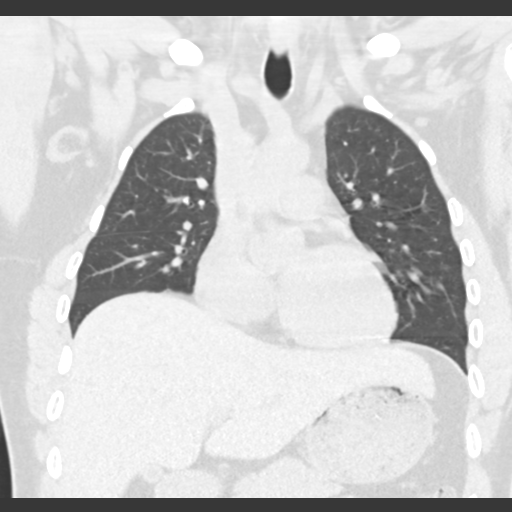
[im 47/79  lung]
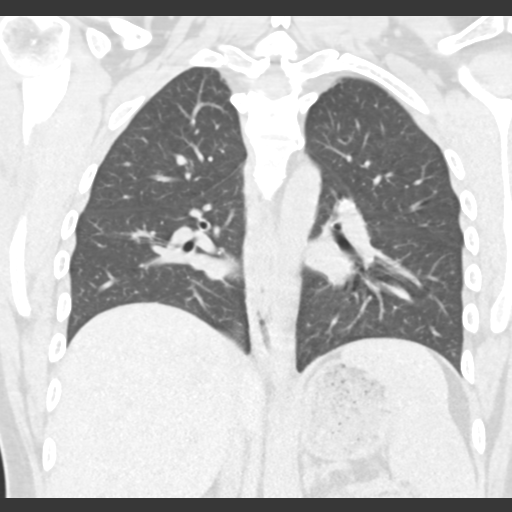

[15 of 36 positions shown; findings below may reference images not displayed]

Report from pulmonary CTA at [REDACTED] dated 05/15/2020 documented a 14 x 12 mm left lower lobe
opacity.
FINDINGS: Cardiovascular: The heart size is normal. No substantial pericardial
effusion.

Mediastinum/Nodes: No mediastinal lymphadenopathy. No evidence for
gross hilar lymphadenopathy although assessment is limited by the
lack of intravenous contrast on today's study. The esophagus has
normal imaging features. There is no axillary lymphadenopathy.

Lungs/Pleura: No evidence for a 14 x 12 mm left lower lobe pulmonary
nodule on today's exam. Previous exam also documented multiple tiny
subpleural nodules in a 9 mm left upper lobe nodular opacity. This 9
mm left upper lobe nodule is also not demonstrated on today's exam.
There is some trace atelectasis or scarring in the inferior lingula.
No focal airspace consolidation. No pleural effusion.

Upper Abdomen: Unremarkable.

Musculoskeletal: No worrisome lytic or sclerotic osseous
abnormality.
IMPRESSION: No evidence for a 14 x 12 mm left lower lobe pulmonary nodule as
described on the prior report. Previous exam also documented
multiple tiny subpleural nodules and a 9 mm left upper lobe nodular
opacity. This 9 mm left upper lobe nodule is also not demonstrated
on today's exam. Direct comparison of the prior study to today's
exam is recommended to ensure that these nodules have truly
resolved.

## 2022-12-24 ENCOUNTER — Telehealth: Payer: Self-pay

## 2022-12-24 NOTE — Telephone Encounter (Signed)
[facility] Patient Advocate Encounter  Patient's medication Jesse Smith) have been couriered to [facility] from [facility] and will be administered on the patient next office visit on 12/25/22.  [name] , CPhT Specialty Pharmacy Patient [facility] for Infectious Disease Phone: [phone] Fax:  [phone]

## 2022-12-25 ENCOUNTER — Ambulatory Visit (INDEPENDENT_AMBULATORY_CARE_PROVIDER_SITE_OTHER): Payer: BC Managed Care – PPO | Admitting: Pharmacist

## 2022-12-25 ENCOUNTER — Other Ambulatory Visit: Payer: Self-pay

## 2022-12-25 DIAGNOSIS — B20 Human immunodeficiency virus [HIV] disease: Secondary | ICD-10-CM

## 2022-12-25 MED ORDER — CABOTEGRAVIR & RILPIVIRINE ER 600 & 900 MG/3ML IM SUER
1.0000 | Freq: Once | INTRAMUSCULAR | Status: AC
  Administered 2022-12-25: 1 via INTRAMUSCULAR

## 2022-12-25 NOTE — Progress Notes (Signed)
HPI: Jesse Smith is a 32 y.o. male who presents to the [facility] pharmacy clinic for [place] administration.  Patient Active Problem List   Diagnosis Date Noted   Pulmonary nodule 06/15/2020   Screening examination for venereal disease 03/31/2018   Encounter for long-term (current) use of high-risk medication 03/31/2018   HPV vaccine counseling 03/31/2018   HIV disease 03/21/2017    Patient's Medications  New Prescriptions   No medications on file  Previous Medications   CABOTEGRAVIR & RILPIVIRINE ER (CABENUVA) 600 & 900 MG/3ML INJECTION    Inject 1 kit into the muscle every 30 (thirty) days.   CABOTEGRAVIR & RILPIVIRINE ER (CABENUVA) 600 & 900 MG/3ML INJECTION    Inject 1 kit into the muscle every 2 (two) months.   DOLUTEGRAVIR-LAMIVUDINE (DOVATO) 50-300 MG TABLET    Take 1 tablet by mouth daily.  Modified Medications   No medications on file  Discontinued Medications   No medications on file    Allergies: Allergies  Allergen Reactions   Erythromycin     Other reaction(s): Unknown   Azithromycin Other (See Comments)    n/a   Ceclor [Cefaclor] Other (See Comments)    n/a   Penicillins Hives    N/a childhood     Past Medical History: Past Medical History:  Diagnosis Date   Asthma     Social History: Social History   Socioeconomic History   Marital status: Single    Spouse name: Not on file   Number of children: Not on file   Years of education: Not on file   Highest education level: Not on file  Occupational History   Not on file  Tobacco Use   Smoking status: Never   Smokeless tobacco: Never  Substance and Sexual Activity   Alcohol use: Yes    Comment: occasional   Drug use: No   Sexual activity: Not Currently    Partners: Male    Birth control/protection: Condom    Comment: accepted condoms  Other Topics Concern   Not on file  Social History Narrative   Not on file   Social Determinants of Health   Financial Resource Strain: Not on file   Food Insecurity: Not on file  Transportation Needs: Not on file  Physical Activity: Not on file  Stress: Not on file  Social Connections: Not on file    Labs: Lab Results  Component Value Date   HIV1RNAQUANT Not Detected 11/27/2022   HIV1RNAQUANT Not Detected 09/21/2021   HIV1RNAQUANT <20 NOT DETECTED 09/22/2020   CD4TABS 864 11/27/2022   CD4TABS 1,000 09/21/2021   CD4TABS 638 09/22/2020    RPR and STI Lab Results  Component Value Date   LABRPR NON-REACTIVE 11/27/2022   LABRPR REACTIVE (A) 09/21/2021   LABRPR REACTIVE (A) 09/22/2020   LABRPR REACTIVE (A) 06/09/2020   LABRPR REACTIVE (A) 07/20/2019   RPRTITER 1:2 (H) 09/21/2021   RPRTITER 1:2 (H) 09/22/2020   RPRTITER 1:16 (H) 06/09/2020   RPRTITER 1:1 (H) 07/20/2019    STI Results GC CT  11/27/2022  4:33 PM Negative    Negative    Negative  Negative    Negative    Negative   06/09/2020  4:03 PM Negative  Negative   07/20/2019 11:41 AM Negative  Negative   03/21/2017 12:00 AM Negative  Negative     Hepatitis B Lab Results  Component Value Date   HEPBSAB REACTIVE (A) 09/22/2020   HEPBSAG NON-REACTIVE 09/22/2020   HEPBCAB REACTIVE (A) 09/22/2020  Hepatitis C No results found for: "HEPCAB", "HCVRNAPCRQN" Hepatitis A Lab Results  Component Value Date   HAV NON REACTIVE 03/21/2017   Lipids: Lab Results  Component Value Date   CHOL 209 (H) 11/27/2022   TRIG 352 (H) 11/27/2022   HDL 32 (L) 11/27/2022   CHOLHDL 6.5 (H) 11/27/2022   VLDL 34 (H) 03/21/2017   LDLCALC 127 (H) 11/27/2022    Current HIV Regimen: Dovato  TARGET DATE: The 9th   Assessment: Jesse Smith presents today for his first initiation injection for Cabenuva. Counseled that Jesse Smith is two separate intramuscular injections in the gluteal muscle on each side for each visit. Explained that the second injection is 30 days after the initial injection then every 2 months thereafter. Discussed the rare but significant chance of developing  resistance despite compliance. Explained that showing up to injection appointments is very important and warned that if 2 appointments are missed, it will be reassessed by their provider whether they are a good candidate for injection therapy. Counseled on possible side effects associated with the injections such as injection site pain, which is usually mild to moderate in nature, injection site nodules, and injection site reactions. Asked to call the clinic or send me a mychart message if they experience any issues, such as fatigue, nausea, headache, rash, or dizziness. Advised that they can take ibuprofen or tylenol for injection site pain if needed.   Administered cabotegravir 600mg /41mL in left upper outer quadrant of the gluteal muscle. Administered rilpivirine 900 mg/43mL in the right upper outer quadrant of the gluteal muscle. Monitored patient for 10 minutes after injection. Injections were tolerated well without issue. Counseled to stop taking Dovato after today's dose and to call with any issues that may arise. Will make follow up appointments for second initiation injection in 30 days and then maintenance injections every 2 months thereafter.   Jesse Smith is eligible for the meningococcal booster and the Tdap booster. He would like to defer these to next time.   Plan: - Stop Dovato after today's dose - First Cabenuva injections administered - Second initiation injection scheduled for May 9th, 2024 - Maintenance injections scheduled for July 3rd, 2024 - Call with any issues or questions  Blane Ohara, PharmD  PGY1 Pharmacy Resident  [facility]

## 2022-12-27 ENCOUNTER — Other Ambulatory Visit: Payer: Self-pay

## 2023-01-09 ENCOUNTER — Other Ambulatory Visit (HOSPITAL_COMMUNITY): Payer: Self-pay

## 2023-01-11 ENCOUNTER — Other Ambulatory Visit (HOSPITAL_COMMUNITY): Payer: Self-pay

## 2023-01-15 ENCOUNTER — Other Ambulatory Visit (HOSPITAL_COMMUNITY): Payer: Self-pay

## 2023-01-17 ENCOUNTER — Telehealth: Payer: Self-pay

## 2023-01-17 NOTE — Telephone Encounter (Signed)
[facility] Patient Advocate Encounter  Patient's medication (Cabenuva) have been couriered to [facility] from [facility] and will be administered on the patient next office visit on 01/24/23.  Daveigh Batty , CPhT Specialty Pharmacy [facility] Phone: [phone] Fax:  [phone]  

## 2023-01-23 NOTE — Progress Notes (Unsigned)
HPI: Jesse Smith is a 32 y.o. male who presents to the [facility] pharmacy clinic for [place] administration.  Patient Active Problem List   Diagnosis Date Noted   Pulmonary nodule 06/15/2020   Screening examination for venereal disease 03/31/2018   Encounter for long-term (current) use of high-risk medication 03/31/2018   HPV vaccine counseling 03/31/2018   HIV disease (HCC) 03/21/2017    Patient's Medications  New Prescriptions   No medications on file  Previous Medications   CABOTEGRAVIR & RILPIVIRINE ER (CABENUVA) 600 & 900 MG/3ML INJECTION    Inject 1 kit into the muscle every 30 (thirty) days.   CABOTEGRAVIR & RILPIVIRINE ER (CABENUVA) 600 & 900 MG/3ML INJECTION    Inject 1 kit into the muscle every 2 (two) months.  Modified Medications   No medications on file  Discontinued Medications   No medications on file    Allergies: Allergies  Allergen Reactions   Erythromycin     Other reaction(s): Unknown   Azithromycin Other (See Comments)    n/a   Ceclor [Cefaclor] Other (See Comments)    n/a   Penicillins Hives    N/a childhood     Past Medical History: Past Medical History:  Diagnosis Date   Asthma     Social History: Social History   Socioeconomic History   Marital status: Single    Spouse name: Not on file   Number of children: Not on file   Years of education: Not on file   Highest education level: Not on file  Occupational History   Not on file  Tobacco Use   Smoking status: Never   Smokeless tobacco: Never  Substance and Sexual Activity   Alcohol use: Yes    Comment: occasional   Drug use: No   Sexual activity: Not Currently    Partners: Male    Birth control/protection: Condom    Comment: accepted condoms  Other Topics Concern   Not on file  Social History Narrative   Not on file   Social Determinants of Health   Financial Resource Strain: Not on file  Food Insecurity: Not on file  Transportation Needs: Not on file  Physical  Activity: Not on file  Stress: Not on file  Social Connections: Not on file    Labs: Lab Results  Component Value Date   HIV1RNAQUANT Not Detected 11/27/2022   HIV1RNAQUANT Not Detected 09/21/2021   HIV1RNAQUANT <20 NOT DETECTED 09/22/2020   CD4TABS 864 11/27/2022   CD4TABS 1,000 09/21/2021   CD4TABS 638 09/22/2020    RPR and STI Lab Results  Component Value Date   LABRPR NON-REACTIVE 11/27/2022   LABRPR REACTIVE (A) 09/21/2021   LABRPR REACTIVE (A) 09/22/2020   LABRPR REACTIVE (A) 06/09/2020   LABRPR REACTIVE (A) 07/20/2019   RPRTITER 1:2 (H) 09/21/2021   RPRTITER 1:2 (H) 09/22/2020   RPRTITER 1:16 (H) 06/09/2020   RPRTITER 1:1 (H) 07/20/2019    STI Results GC CT  11/27/2022  4:33 PM Negative    Negative    Negative  Negative    Negative    Negative   06/09/2020  4:03 PM Negative  Negative   07/20/2019 11:41 AM Negative  Negative   03/21/2017 12:00 AM Negative  Negative     Hepatitis B Lab Results  Component Value Date   HEPBSAB REACTIVE (A) 09/22/2020   HEPBSAG NON-REACTIVE 09/22/2020   HEPBCAB REACTIVE (A) 09/22/2020   Hepatitis C No results found for: "HEPCAB", "HCVRNAPCRQN" Hepatitis A Lab Results  Component  Value Date   HAV NON REACTIVE 03/21/2017   Lipids: Lab Results  Component Value Date   CHOL 209 (H) 11/27/2022   TRIG 352 (H) 11/27/2022   HDL 32 (L) 11/27/2022   CHOLHDL 6.5 (H) 11/27/2022   VLDL 34 (H) 03/21/2017   LDLCALC 127 (H) 11/27/2022    TARGET DATE: the 9th  Assessment: Jesse Smith presents today for his maintenance Cabenuva injections. Past injections were tolerated well without issues.  Administered cabotegravir 600mg /86mL in left upper outer quadrant of the gluteal muscle. Administered rilpivirine 900 mg/56mL in the right upper outer quadrant of the gluteal muscle. No issues with injections. *** will follow up in 2 months for next set of injections.  Jesse Smith is eligible for the meningococcal booster, Tdap booster, and monkeypox  vaccination.   Plan: - Cabenuva injections administered - Next injections scheduled for *** - Call with any issues or questions  Irish Elders, PharmD PGY-1 [facility] Resident

## 2023-01-24 ENCOUNTER — Other Ambulatory Visit: Payer: Self-pay

## 2023-01-24 ENCOUNTER — Ambulatory Visit (INDEPENDENT_AMBULATORY_CARE_PROVIDER_SITE_OTHER): Payer: BC Managed Care – PPO | Admitting: Pharmacist

## 2023-01-24 DIAGNOSIS — Z23 Encounter for immunization: Secondary | ICD-10-CM

## 2023-01-24 DIAGNOSIS — Z113 Encounter for screening for infections with a predominantly sexual mode of transmission: Secondary | ICD-10-CM

## 2023-01-24 DIAGNOSIS — B20 Human immunodeficiency virus [HIV] disease: Secondary | ICD-10-CM | POA: Diagnosis not present

## 2023-01-24 MED ORDER — CABOTEGRAVIR & RILPIVIRINE ER 600 & 900 MG/3ML IM SUER
1.0000 | Freq: Once | INTRAMUSCULAR | Status: AC
  Administered 2023-01-24: 1 via INTRAMUSCULAR

## 2023-01-26 LAB — HEPATITIS A ANTIBODY, TOTAL: Hepatitis A AB,Total: REACTIVE — AB

## 2023-01-26 LAB — HIV-1 RNA QUANT-NO REFLEX-BLD
HIV 1 RNA Quant: NOT DETECTED Copies/mL
HIV-1 RNA Quant, Log: NOT DETECTED Log cps/mL

## 2023-03-04 ENCOUNTER — Other Ambulatory Visit (HOSPITAL_COMMUNITY): Payer: Self-pay

## 2023-03-04 ENCOUNTER — Other Ambulatory Visit: Payer: Self-pay

## 2023-03-12 ENCOUNTER — Other Ambulatory Visit (HOSPITAL_COMMUNITY): Payer: Self-pay

## 2023-03-14 ENCOUNTER — Telehealth: Payer: Self-pay | Admitting: Pharmacy Technician

## 2023-03-14 NOTE — Telephone Encounter (Signed)
[facility] Patient Advocate Encounter  Patient's medications Jesse Smith) have been couriered to [facility] from [facility] and will be administered on the pts next office visit 03/20/23.

## 2023-03-20 ENCOUNTER — Ambulatory Visit: Payer: BC Managed Care – PPO | Admitting: Pharmacist

## 2023-03-27 ENCOUNTER — Ambulatory Visit: Payer: BC Managed Care – PPO | Admitting: Pharmacist

## 2023-03-27 ENCOUNTER — Other Ambulatory Visit: Payer: Self-pay

## 2023-03-27 DIAGNOSIS — B20 Human immunodeficiency virus [HIV] disease: Secondary | ICD-10-CM

## 2023-03-27 MED ORDER — CABOTEGRAVIR & RILPIVIRINE ER 600 & 900 MG/3ML IM SUER
1.0000 | Freq: Once | INTRAMUSCULAR | Status: AC
  Administered 2023-03-27: 1 via INTRAMUSCULAR

## 2023-03-27 NOTE — Progress Notes (Signed)
HPI: HONDO NANDA is a 32 y.o. male who presents to the [facility] pharmacy clinic for [place] administration.  Patient Active Problem List   Diagnosis Date Noted   Pulmonary nodule 06/15/2020   Screening examination for venereal disease 03/31/2018   Encounter for long-term (current) use of high-risk medication 03/31/2018   HPV vaccine counseling 03/31/2018   HIV disease (HCC) 03/21/2017    Patient's Medications  New Prescriptions   No medications on file  Previous Medications   CABOTEGRAVIR & RILPIVIRINE ER (CABENUVA) 600 & 900 MG/3ML INJECTION    Inject 1 kit into the muscle every 30 (thirty) days.   CABOTEGRAVIR & RILPIVIRINE ER (CABENUVA) 600 & 900 MG/3ML INJECTION    Inject 1 kit into the muscle every 2 (two) months.  Modified Medications   No medications on file  Discontinued Medications   No medications on file    Allergies: Allergies  Allergen Reactions   Erythromycin     Other reaction(s): Unknown   Azithromycin Other (See Comments)    n/a   Ceclor [Cefaclor] Other (See Comments)    n/a   Penicillins Hives    N/a childhood     Labs: Lab Results  Component Value Date   HIV1RNAQUANT Not Detected 01/24/2023   HIV1RNAQUANT Not Detected 11/27/2022   HIV1RNAQUANT Not Detected 09/21/2021   CD4TABS 864 11/27/2022   CD4TABS 1,000 09/21/2021   CD4TABS 638 09/22/2020    RPR and STI Lab Results  Component Value Date   LABRPR NON-REACTIVE 11/27/2022   LABRPR REACTIVE (A) 09/21/2021   LABRPR REACTIVE (A) 09/22/2020   LABRPR REACTIVE (A) 06/09/2020   LABRPR REACTIVE (A) 07/20/2019   RPRTITER 1:2 (H) 09/21/2021   RPRTITER 1:2 (H) 09/22/2020   RPRTITER 1:16 (H) 06/09/2020   RPRTITER 1:1 (H) 07/20/2019    STI Results GC CT  11/27/2022  4:33 PM Negative    Negative    Negative  Negative    Negative    Negative   06/09/2020  4:03 PM Negative  Negative   07/20/2019 11:41 AM Negative  Negative   03/21/2017 12:00 AM Negative  Negative     Hepatitis B Lab  Results  Component Value Date   HEPBSAB REACTIVE (A) 09/22/2020   HEPBSAG NON-REACTIVE 09/22/2020   HEPBCAB REACTIVE (A) 09/22/2020   Hepatitis C No results found for: "HEPCAB", "HCVRNAPCRQN" Hepatitis A Lab Results  Component Value Date   HAV REACTIVE (A) 01/24/2023   Lipids: Lab Results  Component Value Date   CHOL 209 (H) 11/27/2022   TRIG 352 (H) 11/27/2022   HDL 32 (L) 11/27/2022   CHOLHDL 6.5 (H) 11/27/2022   VLDL 34 (H) 03/21/2017   LDLCALC 127 (H) 11/27/2022    TARGET DATE: The 9th  Assessment: Kaesyn presents today for his maintenance Cabenuva injections. Past injections were tolerated well without issues. Last HIV RNA was undetectable in May. Will check again today. No symptoms or concerns for STIs and politely declines testing today.   Administered cabotegravir 600mg /68mL in left upper outer quadrant of the gluteal muscle. Administered rilpivirine 900 mg/50mL in the right upper outer quadrant of the gluteal muscle. No issues with injections. He will follow up in 2 months for next set of injections.  Plan: - Cabenuva injections administered - HIV RNA today - Next injections scheduled for 05/23/23 with Dr. Luciana Axe and 07/22/23 with me - Call with any issues or questions  Sherl Yzaguirre L. Shantel Wesely, PharmD, BCIDP, AAHIVP, CPP Clinical Pharmacist Practitioner Infectious Diseases Clinical Pharmacist [facility]  for Infectious Disease.

## 2023-03-28 ENCOUNTER — Ambulatory Visit: Payer: BC Managed Care – PPO | Admitting: Pharmacist

## 2023-03-29 LAB — HIV-1 RNA QUANT-NO REFLEX-BLD
HIV 1 RNA Quant: NOT DETECTED Copies/mL
HIV-1 RNA Quant, Log: NOT DETECTED Log cps/mL

## 2023-04-19 ENCOUNTER — Ambulatory Visit (INDEPENDENT_AMBULATORY_CARE_PROVIDER_SITE_OTHER): Payer: BC Managed Care – PPO | Admitting: Podiatry

## 2023-04-19 ENCOUNTER — Encounter: Payer: Self-pay | Admitting: Podiatry

## 2023-04-19 DIAGNOSIS — L600 Ingrowing nail: Secondary | ICD-10-CM | POA: Diagnosis not present

## 2023-04-19 DIAGNOSIS — L601 Onycholysis: Secondary | ICD-10-CM

## 2023-04-19 MED ORDER — SULFAMETHOXAZOLE-TRIMETHOPRIM 800-160 MG PO TABS: 1.0000 | ORAL_TABLET | Freq: Two times a day (BID) | ORAL | 0 refills | Status: DC

## 2023-04-19 NOTE — Progress Notes (Unsigned)
Subjective:   Patient ID: Jesse Smith, male   DOB: 33 y.o.   MRN: [id number]   HPI Chief Complaint  Patient presents with   Nail Problem    Right hallux nail bruised and lifting from nail bed after getting a pedicure. Painful. Not diabetic.    32 year old male presents the office today with above concerns.  This week he notes the nail lifting is getting red and swollen the corners causing discomfort.  No injuries that he reports.  He does get pedicures.  Review of Systems  All other systems reviewed and are negative.  Past Medical History:  Diagnosis Date   Asthma     Past Surgical History:  Procedure Laterality Date   TONSILLECTOMY       Current Outpatient Medications:    sulfamethoxazole-trimethoprim (BACTRIM DS) 800-160 MG tablet, Take 1 tablet by mouth 2 (two) times daily., Disp: 14 tablet, Rfl: 0   cabotegravir & rilpivirine ER (CABENUVA) 600 & 900 MG/3ML injection, Inject 1 kit into the muscle every 30 (thirty) days., Disp: 6 mL, Rfl: 1   cabotegravir & rilpivirine ER (CABENUVA) 600 & 900 MG/3ML injection, Inject 1 kit into the muscle every 2 (two) months., Disp: 6 mL, Rfl: 5  Allergies  Allergen Reactions   Erythromycin     Other reaction(s): Unknown   Azithromycin Other (See Comments)    n/a   Ceclor [Cefaclor] Other (See Comments)    n/a   Penicillins Hives    N/a childhood           Objective:  Physical Exam  General: AAO x3, NAD  Dermatological: Right hallux nail is loose form the underlying nail bed. There is localized edema and erythema along the nail border.  Minimal ingrowing of the nail of the medial, lateral nail borders proximally.  There is no ascending cellulitis.  There is no purulence.  No open lesions otherwise.  Vascular: Dorsalis Pedis artery and Posterior Tibial artery pedal pulses are 2/4 bilateral with immedate capillary fill time. There is no pain with calf compression, swelling, warmth, erythema.   Neruologic: Grossly intact via  light touch bilateral.   Musculoskeletal: Tenderness to right hallux nail.  No other areas of discomfort.  Gait: Unassisted, Nonantalgic.       Assessment:   Right hallux onycholysis, localized infection     Plan:  -Treatment options discussed including all alternatives, risks, and complications -Etiology of symptoms were discussed -At this time, recommended total nail removal without chemical matricectomy to the hallux nail given loosening, infection.  Risks and complications were discussed with the patient for which they understand and  verbally consent to the procedure. Under sterile conditions a total of 3 mL of a mixture of 2% lidocaine plain and 0.5% Marcaine plain was infiltrated in a hallux block fashion. Once anesthetized, the skin was prepped in sterile fashion. A tourniquet was then applied. Next the right hallux nail was removed in total making sure to remove all nail borders.  Once the nail was  Removed, the area was debrided and the underlying skin was intact. The area was irrigated and hemostasis was obtained.  A dry sterile dressing was applied. After application of the dressing the tourniquet was removed and there is found to be an immediate capillary refill time to the digit. The patient tolerated the procedure well any complications. Post procedure instructions were discussed the patient for which he verbally understood. Follow-up in one week for nail check or sooner if any problems are to  arise. Discussed signs/symptoms of worsening infection and directed to call the office immediately should any occur or go directly to the emergency room. In the meantime, encouraged to call the office with any questions, concerns, changes symptoms. -Bactirm  Return in about 2 weeks (around 05/03/2023) for nail check.  Vivi Barrack DPM

## 2023-04-19 NOTE — Patient Instructions (Signed)
Soak Instructions    THE DAY AFTER THE PROCEDURE  Place 1/4 cup of epsom salts in a quart of warm tap water.  Submerge your foot or feet with outer bandage intact for the initial soak; this will allow the bandage to become moist and wet for easy lift off.  Once you remove your bandage, continue to soak in the solution for 20 minutes.  This soak should be done twice a day.  Next, remove your foot or feet from solution, blot dry the affected area and cover.  You may use a band aid large enough to cover the area or use gauze and tape.  Apply other medications to the area as directed by the doctor such as polysporin neosporin.  IF YOUR SKIN BECOMES IRRITATED WHILE USING THESE INSTRUCTIONS, IT IS OKAY TO SWITCH TO   VINEGAR AND WATER. Or you may use antibacterial soap and water to keep the toe clean  Monitor for any signs/symptoms of infection. Call the office immediately if any occur or go directly to the emergency room. Call with any questions/concerns.   

## 2023-04-29 ENCOUNTER — Ambulatory Visit: Payer: BC Managed Care – PPO | Admitting: Podiatry

## 2023-05-06 ENCOUNTER — Ambulatory Visit: Payer: BC Managed Care – PPO | Admitting: Podiatry

## 2023-05-09 ENCOUNTER — Other Ambulatory Visit (HOSPITAL_COMMUNITY): Payer: Self-pay

## 2023-05-10 ENCOUNTER — Other Ambulatory Visit (HOSPITAL_COMMUNITY): Payer: Self-pay

## 2023-05-13 ENCOUNTER — Telehealth: Payer: Self-pay

## 2023-05-13 NOTE — Telephone Encounter (Signed)
[facility] Patient Advocate Encounter  Patient's medication Jesse Smith) have been couriered to [facility] from [facility] and will be administered on the patient next office visit on 05/23/23.  [name] , CPhT Specialty Pharmacy Patient [facility] for Infectious Disease Phone: [phone] Fax:  [phone]

## 2023-05-23 ENCOUNTER — Ambulatory Visit (INDEPENDENT_AMBULATORY_CARE_PROVIDER_SITE_OTHER): Payer: BC Managed Care – PPO | Admitting: Internal Medicine

## 2023-05-23 ENCOUNTER — Other Ambulatory Visit (HOSPITAL_COMMUNITY)
Admission: RE | Admit: 2023-05-23 | Discharge: 2023-05-23 | Disposition: A | Discharge status: Home / Self Care | Payer: BC Managed Care – PPO | Source: Ambulatory Visit | Attending: Internal Medicine | Admitting: Internal Medicine

## 2023-05-23 ENCOUNTER — Encounter: Payer: Self-pay | Admitting: Internal Medicine

## 2023-05-23 ENCOUNTER — Other Ambulatory Visit: Payer: Self-pay

## 2023-05-23 VITALS — BP 129/85 | HR 63 | Temp 98.3°F | Resp 16 | Wt 197.0 lb

## 2023-05-23 DIAGNOSIS — B20 Human immunodeficiency virus [HIV] disease: Secondary | ICD-10-CM

## 2023-05-23 DIAGNOSIS — Z113 Encounter for screening for infections with a predominantly sexual mode of transmission: Secondary | ICD-10-CM

## 2023-05-23 DIAGNOSIS — Z23 Encounter for immunization: Secondary | ICD-10-CM

## 2023-05-23 MED ORDER — CABOTEGRAVIR & RILPIVIRINE ER 600 & 900 MG/3ML IM SUER
1.0000 | Freq: Once | INTRAMUSCULAR | Status: AC
  Administered 2023-05-23: 1 via INTRAMUSCULAR

## 2023-05-23 NOTE — Assessment & Plan Note (Signed)
He is doing well on Cabenuva and no changes indicated.   Given today and has follow up with pharmacy

## 2023-05-23 NOTE — Progress Notes (Signed)
   Subjective:    Patient ID: Jesse Smith, male    DOB: Oct 08, 1990, 32 y.o.   MRN: [id number]  HPI Jesse Smith is here for follow up of HIV He is now on [place] and no concerns.  Pleased with it and no issues getting to his appointments.  No signficant injection reaction.  Has remained not detected.     Review of Systems  Constitutional:  Negative for fatigue.  Gastrointestinal:  Negative for diarrhea and nausea.  Skin:  Negative for rash.       Objective:   Physical Exam Eyes:     General: No scleral icterus. Pulmonary:     Effort: Pulmonary effort is normal.  Skin:    Findings: No rash.  Neurological:     Mental Status: He is alert.   SH: no tobacco        Assessment & Plan:

## 2023-05-23 NOTE — Addendum Note (Signed)
Addended by: Marcell Anger on: 05/23/2023 11:29 AM   Modules accepted: Orders

## 2023-05-23 NOTE — Assessment & Plan Note (Signed)
Flu shot offered and declined for now.

## 2023-05-23 NOTE — Assessment & Plan Note (Signed)
Will screen with throat swab No concerns.

## 2023-05-24 LAB — CYTOLOGY, (ORAL, ANAL, URETHRAL) ANCILLARY ONLY
Chlamydia: NEGATIVE
Comment: NEGATIVE
Comment: NORMAL
Neisseria Gonorrhea: NEGATIVE

## 2023-05-24 LAB — T-HELPER CELL (CD4) - (RCID CLINIC ONLY)
CD4 % Helper T Cell: 40 % (ref 33–65)
CD4 T Cell Abs: 815 /uL (ref 400–1790)

## 2023-05-25 LAB — HIV-1 RNA QUANT-NO REFLEX-BLD
HIV 1 RNA Quant: NOT DETECTED {copies}/mL
HIV-1 RNA Quant, Log: NOT DETECTED {Log_copies}/mL

## 2023-07-08 ENCOUNTER — Other Ambulatory Visit (HOSPITAL_COMMUNITY): Payer: Self-pay

## 2023-07-08 ENCOUNTER — Other Ambulatory Visit: Payer: Self-pay

## 2023-07-08 NOTE — Progress Notes (Signed)
Specialty Pharmacy Refill Coordination Note  Jesse Smith is a 32 y.o. male assessed today regarding refills of clinic administered specialty medication(s) Cabotegravir & Rilpivirine   Clinic requested Courier to Provider Office   Delivery date: 07/17/23   Verified address: [facility] [address] [place] [address]   Medication will be filled on 07/16/23.

## 2023-07-17 ENCOUNTER — Telehealth: Payer: Self-pay

## 2023-07-17 NOTE — Telephone Encounter (Signed)
[facility] Patient Advocate Encounter  Patient's medications CABENUVA have been couriered to [facility] from [facility] and will be administered at the patients appointment on 07/22/23.  Kae Heller, CPhT Specialty Pharmacy Patient [facility] for Infectious Disease Phone: [phone] Fax:  [phone]

## 2023-07-21 NOTE — Progress Notes (Unsigned)
HPI: Jesse Smith is a 32 y.o. male who presents to the [facility] pharmacy clinic for [name] administration.  Patient Active Problem List   Diagnosis Date Noted   Need for prophylactic vaccination and inoculation against influenza 05/23/2023   Pulmonary nodule 06/15/2020   Screening examination for venereal disease 03/31/2018   Encounter for long-term (current) use of high-risk medication 03/31/2018   HIV disease (HCC) 03/21/2017    Patient's Medications  New Prescriptions   No medications on file  Previous Medications   CABOTEGRAVIR & RILPIVIRINE ER (CABENUVA) 600 & 900 MG/3ML INJECTION    Inject 1 kit into the muscle every 30 (thirty) days.   CABOTEGRAVIR & RILPIVIRINE ER (CABENUVA) 600 & 900 MG/3ML INJECTION    Inject 1 kit into the muscle every 2 (two) months.  Modified Medications   No medications on file  Discontinued Medications   No medications on file    Allergies: Allergies  Allergen Reactions   Erythromycin     Other reaction(s): Unknown   Azithromycin Other (See Comments)    n/a   Ceclor [Cefaclor] Other (See Comments)    n/a   Penicillins Hives    N/a childhood     Labs: Lab Results  Component Value Date   HIV1RNAQUANT Not Detected 05/23/2023   HIV1RNAQUANT Not Detected 03/27/2023   HIV1RNAQUANT Not Detected 01/24/2023   CD4TABS 815 05/23/2023   CD4TABS 864 11/27/2022   CD4TABS 1,000 09/21/2021    RPR and STI Lab Results  Component Value Date   LABRPR NON-REACTIVE 11/27/2022   LABRPR REACTIVE (A) 09/21/2021   LABRPR REACTIVE (A) 09/22/2020   LABRPR REACTIVE (A) 06/09/2020   LABRPR REACTIVE (A) 07/20/2019   RPRTITER 1:2 (H) 09/21/2021   RPRTITER 1:2 (H) 09/22/2020   RPRTITER 1:16 (H) 06/09/2020   RPRTITER 1:1 (H) 07/20/2019    STI Results GC CT  05/23/2023 11:04 AM Negative  Negative   11/27/2022  4:33 PM Negative    Negative    Negative  Negative    Negative    Negative   06/09/2020  4:03 PM Negative  Negative   07/20/2019 11:41 AM  Negative  Negative   03/21/2017 12:00 AM Negative  Negative     Hepatitis B Lab Results  Component Value Date   HEPBSAB REACTIVE (A) 09/22/2020   HEPBSAG NON-REACTIVE 09/22/2020   HEPBCAB REACTIVE (A) 09/22/2020   Hepatitis C No results found for: "HEPCAB", "HCVRNAPCRQN" Hepatitis A Lab Results  Component Value Date   HAV REACTIVE (A) 01/24/2023   Lipids: Lab Results  Component Value Date   CHOL 209 (H) 11/27/2022   TRIG 352 (H) 11/27/2022   HDL 32 (L) 11/27/2022   CHOLHDL 6.5 (H) 11/27/2022   VLDL 34 (H) 03/21/2017   LDLCALC 127 (H) 11/27/2022    TARGET DATE: The 9th of the month  Assessment: Jesse Smith presents today for his maintenance Cabenuva injections. Past injections were tolerated well without issues. His last visit with Jesse Smith was 05/23/23, due for next visit with Jesse Smith in March 2025. [name] reports *** exposures to STIs since last visit. He agrees to RPR, oral/rectal/urine cytologies for gonorrhea/chlamydia.  Administered cabotegravir 600mg /56mL in left upper outer quadrant of the gluteal muscle. Administered rilpivirine 900 mg/30mL in the right upper outer quadrant of the gluteal muscle. No issues with injections. *** will follow up in 2 months for next set of injections.  Immunizations: COVID, influenza, Tdap  Plan: - Cabenuva injections administered - Next injections scheduled for *** - RPR,  oral/rectal/urine cytologies for gonorrhea/chlamydia. - Call with any issues or questions  Jesse Smith, PharmD PGY-2 Infectious Diseases Pharmacy Resident 07/21/2023 10:19 PM

## 2023-07-22 ENCOUNTER — Ambulatory Visit: Payer: BC Managed Care – PPO | Admitting: Pharmacist

## 2023-07-22 NOTE — Progress Notes (Unsigned)
HPI: Jesse Smith is a 32 y.o. male who presents to the [facility] pharmacy clinic for Jesse Smith administration.  Patient Active Problem List   Diagnosis Date Noted   Need for prophylactic vaccination and inoculation against influenza 05/23/2023   Pulmonary nodule 06/15/2020   Screening examination for venereal disease 03/31/2018   Encounter for long-term (current) use of high-risk medication 03/31/2018   HIV disease (HCC) 03/21/2017    Patient's Medications  New Prescriptions   No medications on file  Previous Medications   CABOTEGRAVIR & RILPIVIRINE ER (CABENUVA) 600 & 900 MG/3ML INJECTION    Inject 1 kit into the muscle every 30 (thirty) days.   CABOTEGRAVIR & RILPIVIRINE ER (CABENUVA) 600 & 900 MG/3ML INJECTION    Inject 1 kit into the muscle every 2 (two) months.  Modified Medications   No medications on file  Discontinued Medications   No medications on file    Allergies: Allergies  Allergen Reactions   Erythromycin     Other reaction(s): Unknown   Azithromycin Other (See Comments)    n/a   Ceclor [Cefaclor] Other (See Comments)    n/a   Penicillins Hives    N/a childhood     Labs: Lab Results  Component Value Date   HIV1RNAQUANT Not Detected 05/23/2023   HIV1RNAQUANT Not Detected 03/27/2023   HIV1RNAQUANT Not Detected 01/24/2023   CD4TABS 815 05/23/2023   CD4TABS 864 11/27/2022   CD4TABS 1,000 09/21/2021    RPR and STI Lab Results  Component Value Date   LABRPR NON-REACTIVE 11/27/2022   LABRPR REACTIVE (A) 09/21/2021   LABRPR REACTIVE (A) 09/22/2020   LABRPR REACTIVE (A) 06/09/2020   LABRPR REACTIVE (A) 07/20/2019   RPRTITER 1:2 (H) 09/21/2021   RPRTITER 1:2 (H) 09/22/2020   RPRTITER 1:16 (H) 06/09/2020   RPRTITER 1:1 (H) 07/20/2019    STI Results GC CT  05/23/2023 11:04 AM Negative  Negative   11/27/2022  4:33 PM Negative    Negative    Negative  Negative    Negative    Negative   06/09/2020  4:03 PM Negative  Negative   07/20/2019 11:41 AM  Negative  Negative   03/21/2017 12:00 AM Negative  Negative     Hepatitis B Lab Results  Component Value Date   HEPBSAB REACTIVE (A) 09/22/2020   HEPBSAG NON-REACTIVE 09/22/2020   HEPBCAB REACTIVE (A) 09/22/2020   Hepatitis C No results found for: "HEPCAB", "HCVRNAPCRQN" Hepatitis A Lab Results  Component Value Date   HAV REACTIVE (A) 01/24/2023   Lipids: Lab Results  Component Value Date   CHOL 209 (H) 11/27/2022   TRIG 352 (H) 11/27/2022   HDL 32 (L) 11/27/2022   CHOLHDL 6.5 (H) 11/27/2022   VLDL 34 (H) 03/21/2017   LDLCALC 127 (H) 11/27/2022    TARGET DATE: The 9th of the month  Assessment: Jesse Smith presents today for their maintenance Cabenuva injections. Past injections were tolerated well without issues. Patient reports no concerns for STI exposures since last visit. He politely declines STI testing today, he reports that he goes roughly every 3 months to get tested at [facility]. Jesse Smith is due for visit with Dr. Luciana Axe in March 2025.  Administered cabotegravir 600mg /52mL in left upper outer quadrant of the gluteal muscle. Administered rilpivirine 900 mg/23mL in the right upper outer quadrant of the gluteal muscle. No issues with injections. Jesse Smith will follow up in 2 months for next set of injections.  Immunizations: Due for COVID, influenza, PCV20, and Tdap. Patient politely declines these  today. Will revisit at next appointment.  Plan: - Cabenuva injections administered - Next injections scheduled for 09/23/22 with Cassie, and 11/21/22 with Dr. Luciana Axe - Call with any issues or questions  Lora Paula, PharmD PGY-2 Infectious Diseases Pharmacy Resident [facility] for Infectious Disease

## 2023-07-23 ENCOUNTER — Other Ambulatory Visit: Payer: Self-pay

## 2023-07-23 ENCOUNTER — Ambulatory Visit (INDEPENDENT_AMBULATORY_CARE_PROVIDER_SITE_OTHER): Payer: BC Managed Care – PPO | Admitting: Pharmacist

## 2023-07-23 DIAGNOSIS — B20 Human immunodeficiency virus [HIV] disease: Secondary | ICD-10-CM | POA: Diagnosis not present

## 2023-07-23 MED ORDER — CABOTEGRAVIR & RILPIVIRINE ER 600 & 900 MG/3ML IM SUER
1.0000 | Freq: Once | INTRAMUSCULAR | Status: AC
  Administered 2023-07-23: 1 via INTRAMUSCULAR

## 2023-07-25 ENCOUNTER — Other Ambulatory Visit: Payer: Self-pay

## 2023-08-22 ENCOUNTER — Other Ambulatory Visit: Payer: Self-pay

## 2023-08-22 ENCOUNTER — Encounter (HOSPITAL_BASED_OUTPATIENT_CLINIC_OR_DEPARTMENT_OTHER): Payer: Self-pay | Admitting: *Deleted

## 2023-08-22 ENCOUNTER — Emergency Department (HOSPITAL_BASED_OUTPATIENT_CLINIC_OR_DEPARTMENT_OTHER)
Admission: EM | Admit: 2023-08-22 | Discharge: 2023-08-22 | Disposition: A | Discharge status: Home / Self Care | Payer: BC Managed Care – PPO | Attending: Emergency Medicine | Admitting: Emergency Medicine

## 2023-08-22 ENCOUNTER — Emergency Department (HOSPITAL_BASED_OUTPATIENT_CLINIC_OR_DEPARTMENT_OTHER): Payer: BC Managed Care – PPO | Admitting: Radiology

## 2023-08-22 DIAGNOSIS — X58XXXA Exposure to other specified factors, initial encounter: Secondary | ICD-10-CM | POA: Diagnosis not present

## 2023-08-22 DIAGNOSIS — S99921A Unspecified injury of right foot, initial encounter: Secondary | ICD-10-CM | POA: Diagnosis present

## 2023-08-22 DIAGNOSIS — S93601A Unspecified sprain of right foot, initial encounter: Secondary | ICD-10-CM | POA: Diagnosis not present

## 2023-08-22 MED ORDER — IBUPROFEN 400 MG PO TABS
600.0000 mg | ORAL_TABLET | Freq: Once | ORAL | Status: AC
  Administered 2023-08-22: 600 mg via ORAL
  Filled 2023-08-22: qty 1

## 2023-08-22 NOTE — ED Notes (Signed)
ED Provider at bedside. 

## 2023-08-22 NOTE — ED Triage Notes (Signed)
Pt felt like he was going to have a cramp in his foot; attempted to stretch his toes out to help with the cramp and has had unbearable pain in R foot since. Previous surgery to same ankle 2021; no obvious injury; swelling noted to R ankle with increased pain on ROM and flexion

## 2023-08-22 NOTE — ED Provider Notes (Signed)
   [place] EMERGENCY DEPARTMENT AT [facility]  Provider Note  CSN: [id number] Arrival date & time: 08/22/23 4098  History Chief Complaint  Patient presents with   Foot Pain    Jesse Smith is a 32 y.o. male with remote history of R ankle ORIF reports he began to have pain in his R foot around 2200hrs he thought was a cramp. He massaged the foot and tried to walk it off but has continued to have pain in the plantar surface of his foot since then. No direct injury/trauma. Has not taken anything for the pain.    Home Medications Prior to Admission medications   Medication Sig Start Date End Date Taking? Authorizing Provider  cabotegravir & rilpivirine ER (CABENUVA) 600 & 900 MG/3ML injection Inject 1 kit into the muscle every 30 (thirty) days. 11/28/22   Kuppelweiser, Cassie L, RPH-CPP  cabotegravir & rilpivirine ER (CABENUVA) 600 & 900 MG/3ML injection Inject 1 kit into the muscle every 2 (two) months. 11/28/22   Kuppelweiser, Cassie L, RPH-CPP     Allergies    Erythromycin, Azithromycin, Ceclor [cefaclor], and Penicillins   Review of Systems   Review of Systems Please see HPI for pertinent positives and negatives  Physical Exam BP (!) 151/89   Pulse 79   Temp 98.4 F (36.9 C)   Resp 16   SpO2 97%   Physical Exam Vitals and nursing note reviewed.  HENT:     Head: Normocephalic.     Nose: Nose normal.  Eyes:     Extraocular Movements: Extraocular movements intact.  Cardiovascular:     Pulses: Normal pulses.  Pulmonary:     Effort: Pulmonary effort is normal.  Musculoskeletal:        General: Tenderness (mild, plantar surface of MTP joints) present. No swelling or deformity. Normal range of motion.     Cervical back: Neck supple.  Skin:    Findings: No rash (on exposed skin).  Neurological:     Mental Status: He is alert and oriented to person, place, and time.  Psychiatric:        Mood and Affect: Mood normal.     ED Results / Procedures /  Treatments   EKG None  Procedures Procedures  Medications Ordered in the ED Medications  ibuprofen (ADVIL) tablet 600 mg (600 mg Oral Given 08/22/23 0305)    Initial Impression and Plan  Patient here with atraumatic foot pain, has history of ankle ORIF but pain is more distal tonight. Will check xray. Motrin for pain.   ED Course   Clinical Course as of 08/22/23 0338  Thu Aug 22, 2023  1191 I personally viewed the images from radiology studies and agree with radiologist interpretation: Foot xray si neg for acute process. Patient has a walking boot at home, recommend rest, elevation and OTC NSAIDs for pain.  [CS]    Clinical Course User Index [CS] Pollyann Savoy, MD     MDM Rules/Calculators/A&P Medical Decision Making Problems Addressed: Sprain of right foot, initial encounter: acute illness or injury  Amount and/or Complexity of Data Reviewed Radiology: ordered and independent interpretation performed. Decision-making details documented in ED Course.  Risk OTC drugs.     Final Clinical Impression(s) / ED Diagnoses Final diagnoses:  Sprain of right foot, initial encounter    Rx / DC Orders ED Discharge Orders     None        Pollyann Savoy, MD 08/22/23 [phone]

## 2023-08-30 ENCOUNTER — Other Ambulatory Visit: Payer: Self-pay | Admitting: Orthopedic Surgery

## 2023-08-30 DIAGNOSIS — S9031XA Contusion of right foot, initial encounter: Secondary | ICD-10-CM

## 2023-09-03 ENCOUNTER — Other Ambulatory Visit (HOSPITAL_COMMUNITY): Payer: Self-pay

## 2023-09-03 ENCOUNTER — Other Ambulatory Visit: Payer: Self-pay

## 2023-09-03 NOTE — Progress Notes (Signed)
Specialty Pharmacy Refill Coordination Note  Jesse Smith is a 32 y.o. male assessed today regarding refills of clinic administered specialty medication(s) Cabotegravir & Rilpivirine [facility])   Clinic requested Courier to Provider Office   Delivery date: 09/16/23   Verified address: [address] [place] [address]   Medication will be filled on 09/13/23.

## 2023-09-04 ENCOUNTER — Ambulatory Visit
Admission: RE | Admit: 2023-09-04 | Discharge: 2023-09-04 | Disposition: A | Discharge status: Home / Self Care | Payer: BC Managed Care – PPO | Source: Ambulatory Visit | Attending: Orthopedic Surgery | Admitting: Orthopedic Surgery

## 2023-09-04 DIAGNOSIS — S9031XA Contusion of right foot, initial encounter: Secondary | ICD-10-CM

## 2023-09-05 ENCOUNTER — Other Ambulatory Visit: Payer: Self-pay | Admitting: Orthopedic Surgery

## 2023-09-05 DIAGNOSIS — R5381 Other malaise: Secondary | ICD-10-CM

## 2023-09-16 ENCOUNTER — Telehealth: Payer: Self-pay

## 2023-09-16 NOTE — Telephone Encounter (Signed)
[facility] Patient Advocate Encounter  Patient's medications CABENUVA have been couriered to [facility] from [facility] pharmacy and will be administered at the patients appointment on 09/24/23.  Kae Heller, CPhT Specialty Pharmacy Patient [facility] for Infectious Disease Phone: [phone] Fax:  [phone]

## 2023-09-24 ENCOUNTER — Ambulatory Visit: Payer: BC Managed Care – PPO | Admitting: Pharmacist

## 2023-09-25 NOTE — Progress Notes (Signed)
 HPI: Jesse Smith is a 33 y.o. male who presents to the [facility] pharmacy clinic for Cabenuva  administration.  Patient Active Problem List   Diagnosis Date Noted   Need for prophylactic vaccination and inoculation against influenza 05/23/2023   Pulmonary nodule 06/15/2020   Screening examination for venereal disease 03/31/2018   Encounter for long-term (current) use of high-risk medication 03/31/2018   HIV disease (HCC) 03/21/2017    Patient's Medications  New Prescriptions   No medications on file  Previous Medications   CABOTEGRAVIR  & RILPIVIRINE  ER (CABENUVA ) 600 & 900 MG/3ML INJECTION    Inject 1 kit into the muscle every 30 (thirty) days.   CABOTEGRAVIR  & RILPIVIRINE  ER (CABENUVA ) 600 & 900 MG/3ML INJECTION    Inject 1 kit into the muscle every 2 (two) months.  Modified Medications   No medications on file  Discontinued Medications   No medications on file    Allergies: Allergies  Allergen Reactions   Erythromycin     Other reaction(s): Unknown   Azithromycin Other (See Comments)    n/a   Ceclor [Cefaclor] Other (See Comments)    n/a   Penicillins Hives    N/a childhood     Labs: Lab Results  Component Value Date   HIV1RNAQUANT Not Detected 05/23/2023   HIV1RNAQUANT Not Detected 03/27/2023   HIV1RNAQUANT Not Detected 01/24/2023   CD4TABS 815 05/23/2023   CD4TABS 864 11/27/2022   CD4TABS 1,000 09/21/2021    RPR and STI Lab Results  Component Value Date   LABRPR NON-REACTIVE 11/27/2022   LABRPR REACTIVE (A) 09/21/2021   LABRPR REACTIVE (A) 09/22/2020   LABRPR REACTIVE (A) 06/09/2020   LABRPR REACTIVE (A) 07/20/2019   RPRTITER 1:2 (H) 09/21/2021   RPRTITER 1:2 (H) 09/22/2020   RPRTITER 1:16 (H) 06/09/2020   RPRTITER 1:1 (H) 07/20/2019    STI Results GC CT  05/23/2023 11:04 AM Negative  Negative   11/27/2022  4:33 PM Negative    Negative    Negative  Negative    Negative    Negative   06/09/2020  4:03 PM Negative  Negative   07/20/2019 11:41 AM  Negative  Negative   03/21/2017 12:00 AM Negative  Negative     Hepatitis B Lab Results  Component Value Date   HEPBSAB REACTIVE (A) 09/22/2020   HEPBSAG NON-REACTIVE 09/22/2020   HEPBCAB REACTIVE (A) 09/22/2020   Hepatitis C No results found for: HEPCAB, HCVRNAPCRQN Hepatitis A Lab Results  Component Value Date   HAV REACTIVE (A) 01/24/2023   Lipids: Lab Results  Component Value Date   CHOL 209 (H) 11/27/2022   TRIG 352 (H) 11/27/2022   HDL 32 (L) 11/27/2022   CHOLHDL 6.5 (H) 11/27/2022   VLDL 34 (H) 03/21/2017   LDLCALC 127 (H) 11/27/2022    TARGET DATE: The 9th  Assessment: Jesse Smith presents today for his maintenance Cabenuva  injections. Past injections were tolerated well without issues. Last HIV RNA was undetectable on 05/23/23. Started Cabenuva  in April 2024 so next viral load due in March with his follow up with Dr. Efrain.   Administered cabotegravir  600mg /71mL in left upper outer quadrant of the gluteal muscle. Administered rilpivirine  900 mg/3mL in the right upper outer quadrant of the gluteal muscle. No issues with injections. He will follow up in 2 months for next set of injections.  Plan: - Cabenuva  injections administered - Next injections scheduled for 11/21/23 with Dr. Efrain and 01/28/24 with me - Call with any issues or questions  Ashon Rosenberg L. Marjani Kobel,  PharmD, BCIDP, AAHIVP, CPP Clinical Pharmacist Practitioner Infectious Diseases Clinical Pharmacist [facility]

## 2023-09-30 ENCOUNTER — Other Ambulatory Visit: Payer: Self-pay

## 2023-09-30 ENCOUNTER — Ambulatory Visit: Payer: BC Managed Care – PPO | Admitting: Pharmacist

## 2023-09-30 DIAGNOSIS — B20 Human immunodeficiency virus [HIV] disease: Secondary | ICD-10-CM

## 2023-09-30 MED ORDER — CABOTEGRAVIR & RILPIVIRINE ER 600 & 900 MG/3ML IM SUER
1.0000 | Freq: Once | INTRAMUSCULAR | Status: AC
  Administered 2023-09-30: 1 via INTRAMUSCULAR

## 2023-10-31 ENCOUNTER — Other Ambulatory Visit: Payer: Self-pay

## 2023-10-31 ENCOUNTER — Telehealth: Payer: Self-pay

## 2023-10-31 ENCOUNTER — Other Ambulatory Visit (HOSPITAL_COMMUNITY): Payer: Self-pay

## 2023-10-31 NOTE — Progress Notes (Signed)
Specialty Pharmacy Refill Coordination Note  Jesse Smith is a 33 y.o. male assessed today regarding refills of clinic administered specialty medication(s) Cabotegravir & Rilpivirine [facility])   Clinic requested Courier to Provider Office   Delivery date: 11/13/23   Verified address: [address] [place] [address]   Medication will be filled on 11/12/23.

## 2023-10-31 NOTE — Telephone Encounter (Signed)
Pharmacy Patient Advocate Encounter- Cabenuva BIV-Pharmacy Benefit:  PA was submitted to [employer] and has been approved through: 10/31/23-10/30/24 Authorization# [id number]  Please send prescription to Specialty Pharmacy: [facility]: [phone]  Estimated Copay is: 0.00

## 2023-11-13 ENCOUNTER — Telehealth: Payer: Self-pay

## 2023-11-13 NOTE — Telephone Encounter (Signed)
 [facility] Patient Advocate Encounter  Patient's medications CABENUVA have been couriered to [facility] from [facility] pharmacy and will be administered at the patients appointment on 11/21/23.  Kae Heller, CPhT Specialty Pharmacy Patient [facility] for Infectious Disease Phone: [phone] Fax:  [phone]

## 2023-11-21 ENCOUNTER — Other Ambulatory Visit: Payer: Self-pay

## 2023-11-21 ENCOUNTER — Encounter: Payer: Self-pay | Admitting: Internal Medicine

## 2023-11-21 ENCOUNTER — Other Ambulatory Visit (HOSPITAL_COMMUNITY)
Admission: RE | Admit: 2023-11-21 | Discharge: 2023-11-21 | Disposition: A | Discharge status: Home / Self Care | Source: Ambulatory Visit | Attending: Internal Medicine | Admitting: Internal Medicine

## 2023-11-21 ENCOUNTER — Ambulatory Visit (INDEPENDENT_AMBULATORY_CARE_PROVIDER_SITE_OTHER): Payer: BC Managed Care – PPO | Admitting: Internal Medicine

## 2023-11-21 ENCOUNTER — Encounter (INDEPENDENT_AMBULATORY_CARE_PROVIDER_SITE_OTHER): Payer: Self-pay

## 2023-11-21 VITALS — BP 127/78 | HR 78 | Temp 97.8°F | Ht 72.0 in | Wt 204.0 lb

## 2023-11-21 DIAGNOSIS — E663 Overweight: Secondary | ICD-10-CM | POA: Insufficient documentation

## 2023-11-21 DIAGNOSIS — B20 Human immunodeficiency virus [HIV] disease: Secondary | ICD-10-CM

## 2023-11-21 DIAGNOSIS — Z113 Encounter for screening for infections with a predominantly sexual mode of transmission: Secondary | ICD-10-CM | POA: Insufficient documentation

## 2023-11-21 DIAGNOSIS — Z79899 Other long term (current) drug therapy: Secondary | ICD-10-CM | POA: Diagnosis not present

## 2023-11-21 MED ORDER — CABOTEGRAVIR & RILPIVIRINE ER 600 & 900 MG/3ML IM SUER
1.0000 | Freq: Once | INTRAMUSCULAR | Status: AC
  Administered 2023-11-21: 1 via INTRAMUSCULAR

## 2023-11-21 NOTE — Assessment & Plan Note (Signed)
Will do routine screening.

## 2023-11-21 NOTE — Assessment & Plan Note (Signed)
 He is doing well on Cabenuva and no new concerns.  Jesse Smith is indicated and injections given today.  He has follow-up arranged.

## 2023-11-21 NOTE — Assessment & Plan Note (Signed)
 He is working on weight loss and may benefit from a weight loss center so we will refer him today

## 2023-11-21 NOTE — Assessment & Plan Note (Signed)
I will check his lipid panel today

## 2023-11-21 NOTE — Progress Notes (Signed)
   Subjective:    Patient ID: Jesse Smith, male    DOB: 10/19/1990, 33 y.o.   MRN: [id number]  HPI Jesse Smith is here for follow-up of HIV. He continues on [place] and has had no issues with the medication.  No issues with getting it covered.  No complaints today.   Review of Systems  Constitutional:  Negative for fatigue.  Gastrointestinal:  Negative for diarrhea.  Skin:  Negative for rash.       Objective:   Physical Exam Eyes:     General: No scleral icterus. Pulmonary:     Effort: Pulmonary effort is normal.  Neurological:     Mental Status: He is alert.   SH: no tobacco        Assessment & Plan:

## 2023-11-22 LAB — URINE CYTOLOGY ANCILLARY ONLY
Chlamydia: NEGATIVE
Comment: NEGATIVE
Comment: NORMAL
Neisseria Gonorrhea: NEGATIVE

## 2023-11-22 LAB — T-HELPER CELL (CD4) - (RCID CLINIC ONLY)
CD4 % Helper T Cell: 44 % (ref 33–65)
CD4 T Cell Abs: 732 /uL (ref 400–1790)

## 2023-11-25 ENCOUNTER — Encounter: Payer: Self-pay | Admitting: Internal Medicine

## 2023-11-25 LAB — COMPLETE METABOLIC PANEL WITH GFR
AG Ratio: 1.4 (calc) (ref 1.0–2.5)
ALT: 36 U/L (ref 9–46)
AST: 19 U/L (ref 10–40)
Albumin: 4.5 g/dL (ref 3.6–5.1)
Alkaline phosphatase (APISO): 79 U/L (ref 36–130)
BUN: 11 mg/dL (ref 7–25)
CO2: 24 mmol/L (ref 20–32)
Calcium: 9.7 mg/dL (ref 8.6–10.3)
Chloride: 105 mmol/L (ref 98–110)
Creat: 0.87 mg/dL (ref 0.60–1.26)
Globulin: 3.2 g/dL (ref 1.9–3.7)
Glucose, Bld: 131 mg/dL — ABNORMAL HIGH (ref 65–99)
Potassium: 3.8 mmol/L (ref 3.5–5.3)
Sodium: 138 mmol/L (ref 135–146)
Total Bilirubin: 0.6 mg/dL (ref 0.2–1.2)
Total Protein: 7.7 g/dL (ref 6.1–8.1)
eGFR: 119 mL/min/{1.73_m2} (ref >=60)

## 2023-11-25 LAB — CBC WITH DIFFERENTIAL/PLATELET
Absolute Lymphocytes: 1847 {cells}/uL (ref 850–3900)
Absolute Monocytes: 401 {cells}/uL (ref 200–950)
Basophils Absolute: 41 {cells}/uL (ref 0–200)
Basophils Relative: 0.7 %
Eosinophils Absolute: 142 {cells}/uL (ref 15–500)
Eosinophils Relative: 2.4 %
HCT: 47.6 % (ref 38.5–50.0)
Hemoglobin: 16.1 g/dL (ref 13.2–17.1)
MCH: 30.8 pg (ref 27.0–33.0)
MCHC: 33.8 g/dL (ref 32.0–36.0)
MCV: 91.2 fL (ref 80.0–100.0)
MPV: 10.9 fL (ref 7.5–12.5)
Monocytes Relative: 6.8 %
Neutro Abs: 3469 {cells}/uL (ref 1500–7800)
Neutrophils Relative %: 58.8 %
Platelets: 253 10*3/uL (ref 140–400)
RBC: 5.22 10*6/uL (ref 4.20–5.80)
RDW: 12.6 % (ref 11.0–15.0)
Total Lymphocyte: 31.3 %
WBC: 5.9 10*3/uL (ref 3.8–10.8)

## 2023-11-25 LAB — LIPID PANEL
Cholesterol: 181 mg/dL (ref <200)
HDL: 30 mg/dL — ABNORMAL LOW (ref >=40)
LDL Cholesterol (Calc): 114 mg/dL — ABNORMAL HIGH
Non-HDL Cholesterol (Calc): 151 mg/dL — ABNORMAL HIGH (ref <130)
Total CHOL/HDL Ratio: 6 (calc) — ABNORMAL HIGH (ref <5.0)
Triglycerides: 260 mg/dL — ABNORMAL HIGH (ref <150)

## 2023-11-25 LAB — RPR: RPR Ser Ql: NONREACTIVE

## 2023-11-25 LAB — HIV-1 RNA QUANT-NO REFLEX-BLD
HIV 1 RNA Quant: NOT DETECTED {copies}/mL
HIV-1 RNA Quant, Log: NOT DETECTED {Log_copies}/mL

## 2023-12-02 ENCOUNTER — Other Ambulatory Visit: Payer: Self-pay | Admitting: Internal Medicine

## 2023-12-02 DIAGNOSIS — E663 Overweight: Secondary | ICD-10-CM

## 2024-01-13 ENCOUNTER — Other Ambulatory Visit: Payer: Self-pay | Admitting: Pharmacist

## 2024-01-13 ENCOUNTER — Other Ambulatory Visit: Payer: Self-pay

## 2024-01-13 ENCOUNTER — Other Ambulatory Visit (HOSPITAL_COMMUNITY): Payer: Self-pay

## 2024-01-13 DIAGNOSIS — B20 Human immunodeficiency virus [HIV] disease: Secondary | ICD-10-CM

## 2024-01-13 MED ORDER — CABOTEGRAVIR & RILPIVIRINE ER 600 & 900 MG/3ML IM SUER
1.0000 | INTRAMUSCULAR | 5 refills | Status: AC
  Filled 2024-01-13: qty 6, 30d supply, fill #0
  Filled 2024-03-19: qty 6, 30d supply, fill #1
  Filled 2024-05-14: qty 6, 30d supply, fill #2
  Filled 2024-07-10: qty 6, 30d supply, fill #3
  Filled 2024-08-24: qty 6, 30d supply, fill #4

## 2024-01-13 NOTE — Progress Notes (Signed)
 Specialty Pharmacy Refill Coordination Note  Jesse Smith is a 33 y.o. male assessed today regarding refills of clinic administered specialty medication(s) Cabotegravir  & Rilpivirine  (CABENUVA )   Clinic requested Courier to Provider Office   Delivery date: 01/23/24   Verified address: [address] [place] [address]   Medication will be filled on 01/22/24.

## 2024-01-14 ENCOUNTER — Other Ambulatory Visit (HOSPITAL_COMMUNITY): Payer: Self-pay

## 2024-01-22 ENCOUNTER — Other Ambulatory Visit: Payer: Self-pay

## 2024-01-22 ENCOUNTER — Ambulatory Visit: Admitting: Dietician

## 2024-01-23 ENCOUNTER — Telehealth: Payer: Self-pay

## 2024-01-23 NOTE — Telephone Encounter (Signed)
 [facility] Patient Advocate Encounter  Patient's medications CABENUVA  have been couriered to [facility] from [facility] and will be administered at the patients appointment on 01/30/24.  Verline Glow, CPhT Specialty Pharmacy Patient [facility] for Infectious Disease Phone: [phone] Fax:  [phone]

## 2024-01-28 ENCOUNTER — Ambulatory Visit: Payer: BC Managed Care – PPO | Admitting: Pharmacist

## 2024-01-29 NOTE — Progress Notes (Unsigned)
 HPI: Jesse Smith is a 33 y.o. male who presents to the [facility] pharmacy clinic for Cabenuva  administration.  Patient Active Problem List   Diagnosis Date Noted   Overweight (BMI 25.0-29.9) 11/21/2023   Need for prophylactic vaccination and inoculation against influenza 05/23/2023   Pulmonary nodule 06/15/2020   Screening examination for venereal disease 03/31/2018   Encounter for long-term (current) use of high-risk medication 03/31/2018   HIV disease (HCC) 03/21/2017    Patient's Medications  New Prescriptions   No medications on file  Previous Medications   CABOTEGRAVIR  & RILPIVIRINE  ER (CABENUVA ) 600 & 900 MG/3ML INJECTION    Inject 1 kit into the muscle every 2 (two) months.  Modified Medications   No medications on file  Discontinued Medications   No medications on file    Allergies: Allergies  Allergen Reactions   Erythromycin     Other reaction(s): Unknown   Azithromycin Other (See Comments)    n/a   Ceclor [Cefaclor] Other (See Comments)    n/a   Penicillins Hives    N/a childhood     Labs: Lab Results  Component Value Date   HIV1RNAQUANT Not Detected 11/21/2023   HIV1RNAQUANT Not Detected 05/23/2023   HIV1RNAQUANT Not Detected 03/27/2023   CD4TABS 732 11/21/2023   CD4TABS 815 05/23/2023   CD4TABS 864 11/27/2022    RPR and STI Lab Results  Component Value Date   LABRPR NON-REACTIVE 11/21/2023   LABRPR NON-REACTIVE 11/27/2022   LABRPR REACTIVE (A) 09/21/2021   LABRPR REACTIVE (A) 09/22/2020   LABRPR REACTIVE (A) 06/09/2020   RPRTITER 1:2 (H) 09/21/2021   RPRTITER 1:2 (H) 09/22/2020   RPRTITER 1:16 (H) 06/09/2020   RPRTITER 1:1 (H) 07/20/2019    STI Results GC CT  11/21/2023 11:26 AM Negative  Negative   05/23/2023 11:04 AM Negative  Negative   11/27/2022  4:33 PM Negative    Negative    Negative  Negative    Negative    Negative   06/09/2020  4:03 PM Negative  Negative   07/20/2019 11:41 AM Negative  Negative   03/21/2017 12:00 AM Negative   Negative     Hepatitis B Lab Results  Component Value Date   HEPBSAB REACTIVE (A) 09/22/2020   HEPBSAG NON-REACTIVE 09/22/2020   HEPBCAB REACTIVE (A) 09/22/2020   Hepatitis C No results found for: "HEPCAB", "HCVRNAPCRQN" Hepatitis A Lab Results  Component Value Date   HAV REACTIVE (A) 01/24/2023   Lipids: Lab Results  Component Value Date   CHOL 181 11/21/2023   TRIG 260 (H) 11/21/2023   HDL 30 (L) 11/21/2023   CHOLHDL 6.0 (H) 11/21/2023   VLDL 34 (H) 03/21/2017   LDLCALC 114 (H) 11/21/2023   TARGET DATE: The 9th  Assessment: [name] presents today for maintenance Cabenuva  injections. Past injections were tolerated well without issues. Last HIV RNA was undetectable in March. Doing well with no issues today. Of note, lipid panel at last visit was elevated with LDL 114 mg/dL and TG 295 mg/dL. Patient reports he was not fasting at time of labs. Since then he has been exercising more and has reduced his intake of fast food, sweets, and sodas. Reports ~30 lb weight loss. Will do oral STI testing today per patient preference.  Administered cabotegravir  600mg /45mL in left upper outer quadrant of the gluteal muscle. Administered rilpivirine  900 mg/3mL in the right upper outer quadrant of the gluteal muscle. No issues with injections. He will follow up in 2 months for next set of injections.  Discussed eligibility for Menveo booster, Prevnar 20, Shingrix, and Tdap today. Patient agreed to receive Menveo today and would like to receive Shingrix and Tdap at his next visit.  Plan: - Cabenuva  injections administered - Oral cytology today - Menveo booster vaccine administered in the right deltoid - Next injections scheduled for 7/725 with Dr. Shereen Dike and 05/25/24 with Cassie - Call with any issues or questions  Georga Killings, PharmD PGY-1 Pharmacy Resident

## 2024-01-30 ENCOUNTER — Other Ambulatory Visit: Payer: Self-pay

## 2024-01-30 ENCOUNTER — Ambulatory Visit: Admitting: Pharmacist

## 2024-01-30 DIAGNOSIS — Z23 Encounter for immunization: Secondary | ICD-10-CM

## 2024-01-30 DIAGNOSIS — B20 Human immunodeficiency virus [HIV] disease: Secondary | ICD-10-CM | POA: Diagnosis not present

## 2024-01-30 DIAGNOSIS — Z113 Encounter for screening for infections with a predominantly sexual mode of transmission: Secondary | ICD-10-CM

## 2024-01-30 MED ORDER — CABOTEGRAVIR & RILPIVIRINE ER 600 & 900 MG/3ML IM SUER
1.0000 | Freq: Once | INTRAMUSCULAR | Status: AC
  Administered 2024-01-30: 1 via INTRAMUSCULAR

## 2024-01-31 LAB — GC/CHLAMYDIA PROBE, AMP (THROAT)
Chlamydia trachomatis RNA: NOT DETECTED
Neisseria gonorrhoeae RNA: NOT DETECTED

## 2024-03-19 ENCOUNTER — Other Ambulatory Visit (HOSPITAL_COMMUNITY): Payer: Self-pay

## 2024-03-19 ENCOUNTER — Other Ambulatory Visit: Payer: Self-pay

## 2024-03-19 NOTE — Progress Notes (Signed)
 Specialty Pharmacy Refill Coordination Note  Jesse Smith is a 33 y.o. male assessed today regarding refills of clinic administered specialty medication(s) Cabotegravir  & Rilpivirine  (CABENUVA )   Clinic requested Courier to Provider Office   Delivery date: 03/23/24   Verified address: [address] [place] [address]   Medication will be filled on 03/19/24.

## 2024-03-23 ENCOUNTER — Telehealth: Payer: Self-pay

## 2024-03-23 ENCOUNTER — Ambulatory Visit: Admitting: Internal Medicine

## 2024-03-23 ENCOUNTER — Other Ambulatory Visit (HOSPITAL_COMMUNITY): Payer: Self-pay

## 2024-03-23 ENCOUNTER — Other Ambulatory Visit: Payer: Self-pay

## 2024-03-23 NOTE — Telephone Encounter (Signed)
 [facility] Patient Advocate Encounter  Patient's medications Cabenuva  have been couriered to [facility] from [facility] and will be administered at the patients appointment on 03/26/24.  Arland Hutchinson, CPhT Specialty Pharmacy Patient [facility] for Infectious Disease Phone: [phone] Fax:  [phone]

## 2024-03-26 ENCOUNTER — Ambulatory Visit: Admitting: Internal Medicine

## 2024-03-27 ENCOUNTER — Ambulatory Visit (INDEPENDENT_AMBULATORY_CARE_PROVIDER_SITE_OTHER): Admitting: Internal Medicine

## 2024-03-27 ENCOUNTER — Other Ambulatory Visit: Payer: Self-pay

## 2024-03-27 DIAGNOSIS — B20 Human immunodeficiency virus [HIV] disease: Secondary | ICD-10-CM | POA: Diagnosis not present

## 2024-03-27 DIAGNOSIS — Z113 Encounter for screening for infections with a predominantly sexual mode of transmission: Secondary | ICD-10-CM

## 2024-03-27 MED ORDER — CABOTEGRAVIR & RILPIVIRINE ER 600 & 900 MG/3ML IM SUER
1.0000 | Freq: Once | INTRAMUSCULAR | Status: AC
  Administered 2024-03-27: 1 via INTRAMUSCULAR

## 2024-03-27 NOTE — Patient Instructions (Signed)
 Testing today: Throat swab, urine, blood   Continue cabenuva  -- shot given today    See me in 6 months for f/u; sees pharmacy every 2 months

## 2024-03-27 NOTE — Progress Notes (Signed)
   Subjective:    Patient ID: Jesse Smith, male    DOB: [birth year], 33 y.o.   MRN: [id number]  HPI Jesse Smith is here for follow-up of HIV.  03/27/24 He continues on Cabenuva  and has had no issues with the medication.  No issues with getting it covered.  No complaints today. Sexually active, not in relationship; no anal receptive sex Patient goes to school -- business administration/arts Patient works also -- Chiropodist for [employer]  Not on any other medications and no other medical problem    Review of Systems  Constitutional:  Negative for fatigue.  Gastrointestinal:  Negative for diarrhea.  Skin:  Negative for rash.       Objective:   Physical Exam Eyes:     General: No scleral icterus. Pulmonary:     Effort: Pulmonary effort is normal.  Neurological:     Mental Status: He is alert.     Labs: Lab Results  Component Value Date   WBC 5.9 11/21/2023   HGB 16.1 11/21/2023   HCT 47.6 11/21/2023   MCV 91.2 11/21/2023   PLT 253 11/21/2023   Last metabolic panel Lab Results  Component Value Date   GLUCOSE 131 (H) 11/21/2023   NA 138 11/21/2023   K 3.8 11/21/2023   CL 105 11/21/2023   CO2 24 11/21/2023   BUN 11 11/21/2023   CREATININE 0.87 11/21/2023   EGFR 119 11/21/2023   CALCIUM 9.7 11/21/2023   PROT 7.7 11/21/2023   ALBUMIN 4.3 03/21/2017   BILITOT 0.6 11/21/2023   ALKPHOS 83 03/21/2017   AST 19 11/21/2023   ALT 36 11/21/2023   HIV: Lab Results  Component Value Date   HIV1RNAQUANT Not Detected 11/21/2023   Lab Results  Component Value Date   CD4TCELL 44 11/21/2023   CD4TABS 732 11/21/2023          Assessment & Plan:   #hiv Risk msm Doing well on cabenuva   03/2024. Will continue cabenuva ; no concern    -discussed u=u -encourage compliance -continue current HIV medication -labs next visit -f/u in 6 months with me; 2 months routinely with pharmacy    #hcm -vaccination Due for tdap and he'll try to get next visit -std 03/2024 rpr  and throat swab/urine gc/chlam; he said no anal receptive sex -hepatitis 09/2020 hep b sab and cAb positive; sAg and dna negative 03/2017 hep c ab negative -tb screening Will do next visit -anal pap N/a

## 2024-03-27 NOTE — Addendum Note (Signed)
 Addended by: GRETEL TULLY HERO on: 03/27/2024 10:53 AM   Modules accepted: Orders

## 2024-03-27 NOTE — Addendum Note (Signed)
 Addended by: CELESTIA LELA HERO on: 03/27/2024 10:49 AM   Modules accepted: Orders

## 2024-03-28 LAB — GC/CHLAMYDIA PROBE, AMP (THROAT)
Chlamydia trachomatis RNA: NOT DETECTED
Neisseria gonorrhoeae RNA: NOT DETECTED

## 2024-03-28 LAB — C. TRACHOMATIS/N. GONORRHOEAE RNA
C. trachomatis RNA, TMA: NOT DETECTED
N. gonorrhoeae RNA, TMA: NOT DETECTED

## 2024-03-28 LAB — RPR: RPR Ser Ql: NONREACTIVE

## 2024-05-14 ENCOUNTER — Other Ambulatory Visit: Payer: Self-pay

## 2024-05-14 ENCOUNTER — Other Ambulatory Visit (HOSPITAL_COMMUNITY): Payer: Self-pay

## 2024-05-14 NOTE — Progress Notes (Signed)
 Specialty Pharmacy Refill Coordination Note  Jesse Smith is a 33 y.o. male assessed today regarding refills of clinic administered specialty medication(s) Cabotegravir  & Rilpivirine  (CABENUVA )   Clinic requested Courier to Provider Office   Delivery date: 05/21/24   Verified address: [address] [place] [address]   Medication will be filled on 05/20/24.

## 2024-05-20 ENCOUNTER — Other Ambulatory Visit: Payer: Self-pay

## 2024-05-21 ENCOUNTER — Telehealth: Payer: Self-pay

## 2024-05-21 NOTE — Telephone Encounter (Signed)
 [facility] Patient Advocate Encounter  Patient's medications CABENUVA  have been couriered to [facility] from [facility] and will be administered at the patients appointment on 05/25/24.  Charmaine Sharps, CPhT Specialty Pharmacy Patient [facility] for Infectious Disease Phone: [phone] Fax:  [phone]

## 2024-05-24 NOTE — Progress Notes (Unsigned)
 HPI: MEGAN HAYDUK is a 33 y.o. male who presents to the [facility] pharmacy clinic for Cabenuva  administration.  Patient Active Problem List   Diagnosis Date Noted   Overweight (BMI 25.0-29.9) 11/21/2023   Need for prophylactic vaccination and inoculation against influenza 05/23/2023   Pulmonary nodule 06/15/2020   Screening examination for venereal disease 03/31/2018   Encounter for long-term (current) use of high-risk medication 03/31/2018   HIV disease (HCC) 03/21/2017    Patient's Medications  New Prescriptions   No medications on file  Previous Medications   CABOTEGRAVIR  & RILPIVIRINE  ER (CABENUVA ) 600 & 900 MG/3ML INJECTION    Inject 1 kit into the muscle every 2 (two) months.  Modified Medications   No medications on file  Discontinued Medications   No medications on file    Allergies: Allergies  Allergen Reactions   Erythromycin     Other reaction(s): Unknown   Azithromycin Other (See Comments)    n/a   Ceclor [Cefaclor] Other (See Comments)    n/a   Penicillins Hives    N/a childhood     Labs: Lab Results  Component Value Date   HIV1RNAQUANT Not Detected 11/21/2023   HIV1RNAQUANT Not Detected 05/23/2023   HIV1RNAQUANT Not Detected 03/27/2023   CD4TABS 732 11/21/2023   CD4TABS 815 05/23/2023   CD4TABS 864 11/27/2022    RPR and STI Lab Results  Component Value Date   LABRPR NON-REACTIVE 03/27/2024   LABRPR NON-REACTIVE 11/21/2023   LABRPR NON-REACTIVE 11/27/2022   LABRPR REACTIVE (A) 09/21/2021   LABRPR REACTIVE (A) 09/22/2020   RPRTITER 1:2 (H) 09/21/2021   RPRTITER 1:2 (H) 09/22/2020   RPRTITER 1:16 (H) 06/09/2020   RPRTITER 1:1 (H) 07/20/2019    STI Results GC CT  11/21/2023 11:26 AM Negative  Negative   05/23/2023 11:04 AM Negative  Negative   11/27/2022  4:33 PM Negative    Negative    Negative  Negative    Negative    Negative   06/09/2020  4:03 PM Negative  Negative   07/20/2019 11:41 AM Negative  Negative   03/21/2017 12:00 AM Negative   Negative     Hepatitis B Lab Results  Component Value Date   HEPBSAB REACTIVE (A) 09/22/2020   HEPBSAG NON-REACTIVE 09/22/2020   HEPBCAB REACTIVE (A) 09/22/2020   Hepatitis C No results found for: HEPCAB, HCVRNAPCRQN Hepatitis A Lab Results  Component Value Date   HAV REACTIVE (A) 01/24/2023   Lipids: Lab Results  Component Value Date   CHOL 181 11/21/2023   TRIG 260 (H) 11/21/2023   HDL 30 (L) 11/21/2023   CHOLHDL 6.0 (H) 11/21/2023   VLDL 34 (H) 03/21/2017   LDLCALC 114 (H) 11/21/2023    TARGET DATE: The 9th  Assessment: Mico presents today for his maintenance Cabenuva  injections. Past injections were tolerated well without issues. Last HIV RNA was undetectable in March. Doing well with no issues today.  Administered cabotegravir  600mg /48mL in left upper outer quadrant of the gluteal muscle. Administered rilpivirine  900 mg/3mL in the right upper outer quadrant of the gluteal muscle. No issues with injections. He will follow up in 2 months for next set of injections.  Discussed eligibility for flu, Tdap, Shingrix, and PCV20 vaccines today. Patient is ________  Plan: - Cabenuva  injections administered - HIV RNA, RPR, oral/urine cytologies for GC/chlamydia collected today - Next injections scheduled for *** - Call with any issues or questions  Izetta Carl, PharmD PGY1 Pharmacy Resident [facility]  05/24/2024 6:37 PM

## 2024-05-25 ENCOUNTER — Ambulatory Visit: Admitting: Pharmacist

## 2024-05-27 NOTE — Progress Notes (Unsigned)
 HPI: Jesse Smith is a 33 y.o. male who presents to the [facility] pharmacy clinic for Cabenuva  administration.  Insured   [x]    Uninsured  []    Patient Active Problem List   Diagnosis Date Noted   Overweight (BMI 25.0-29.9) 11/21/2023   Need for prophylactic vaccination and inoculation against influenza 05/23/2023   Pulmonary nodule 06/15/2020   Screening examination for venereal disease 03/31/2018   Encounter for long-term (current) use of high-risk medication 03/31/2018   HIV disease (HCC) 03/21/2017    Patient's Medications  New Prescriptions   No medications on file  Previous Medications   CABOTEGRAVIR  & RILPIVIRINE  ER (CABENUVA ) 600 & 900 MG/3ML INJECTION    Inject 1 kit into the muscle every 2 (two) months.  Modified Medications   No medications on file  Discontinued Medications   No medications on file    Allergies: Allergies  Allergen Reactions   Erythromycin     Other reaction(s): Unknown   Azithromycin Other (See Comments)    n/a   Ceclor [Cefaclor] Other (See Comments)    n/a   Penicillins Hives    N/a childhood     Labs: Lab Results  Component Value Date   HIV1RNAQUANT Not Detected 11/21/2023   HIV1RNAQUANT Not Detected 05/23/2023   HIV1RNAQUANT Not Detected 03/27/2023   CD4TABS 732 11/21/2023   CD4TABS 815 05/23/2023   CD4TABS 864 11/27/2022    RPR and STI Lab Results  Component Value Date   LABRPR NON-REACTIVE 03/27/2024   LABRPR NON-REACTIVE 11/21/2023   LABRPR NON-REACTIVE 11/27/2022   LABRPR REACTIVE (A) 09/21/2021   LABRPR REACTIVE (A) 09/22/2020   RPRTITER 1:2 (H) 09/21/2021   RPRTITER 1:2 (H) 09/22/2020   RPRTITER 1:16 (H) 06/09/2020   RPRTITER 1:1 (H) 07/20/2019    STI Results GC CT  11/21/2023 11:26 AM Negative  Negative   05/23/2023 11:04 AM Negative  Negative   11/27/2022  4:33 PM Negative    Negative    Negative  Negative    Negative    Negative   06/09/2020  4:03 PM Negative  Negative   07/20/2019 11:41 AM Negative   Negative   03/21/2017 12:00 AM Negative  Negative     Hepatitis B Lab Results  Component Value Date   HEPBSAB REACTIVE (A) 09/22/2020   HEPBSAG NON-REACTIVE 09/22/2020   HEPBCAB REACTIVE (A) 09/22/2020   Hepatitis C No results found for: HEPCAB, HCVRNAPCRQN Hepatitis A Lab Results  Component Value Date   HAV REACTIVE (A) 01/24/2023   Lipids: Lab Results  Component Value Date   CHOL 181 11/21/2023   TRIG 260 (H) 11/21/2023   HDL 30 (L) 11/21/2023   CHOLHDL 6.0 (H) 11/21/2023   VLDL 34 (H) 03/21/2017   LDLCALC 114 (H) 11/21/2023     TARGET DATE: The 9th (window 2nd-16th)   Assessment: Zyren presents today for maintenance Cabenuva  injections. Past injections were tolerated well without issues. Last HIV RNA was undetectable in March. Doing well with no issues today. Will do oral STI testing today per patient preference.   Administered cabotegravir  600mg /71mL in left upper outer quadrant of the gluteal muscle. Administered rilpivirine  900 mg/3mL in the right upper outer quadrant of the gluteal muscle. No issues with injections. He will follow up in 2 months for next set of injections.   Discussed eligibility Influenza, Prevnar 20, Shingrix, and Tdap vaccines today.   Plan: - Cabenuva  injections administered - HIV RNA - Oral and urine *** cytology today - Offer flu, shingrex, prevnar,  Tdap vaccines *** - TB blood screening ??? *** - Schedule follow up between 11/2-11/16 *** - Schedule follow up with Dr. Overton in January. - Call with any issues or questions  Comer Right PharmD Candidate

## 2024-05-28 ENCOUNTER — Ambulatory Visit: Admitting: Pharmacist

## 2024-05-28 ENCOUNTER — Telehealth: Payer: Self-pay

## 2024-05-28 NOTE — Telephone Encounter (Signed)
 Attempted to call patient in regards to today's missed appointment and rescheduling. Left a HIPAA-complaint voicemail with direct call back number.   Woodie Jock, PharmD PGY1 Pharmacy Resident  05/28/2024

## 2024-05-29 NOTE — Progress Notes (Unsigned)
 HPI: Jesse Smith is a 33 y.o. male who presents to the [facility] pharmacy clinic for Cabenuva  administration.  Patient Active Problem List   Diagnosis Date Noted   Overweight (BMI 25.0-29.9) 11/21/2023   Need for prophylactic vaccination and inoculation against influenza 05/23/2023   Pulmonary nodule 06/15/2020   Screening examination for venereal disease 03/31/2018   Encounter for long-term (current) use of high-risk medication 03/31/2018   HIV disease (HCC) 03/21/2017    Patient's Medications  New Prescriptions   No medications on file  Previous Medications   CABOTEGRAVIR  & RILPIVIRINE  ER (CABENUVA ) 600 & 900 MG/3ML INJECTION    Inject 1 kit into the muscle every 2 (two) months.  Modified Medications   No medications on file  Discontinued Medications   No medications on file    Allergies: Allergies  Allergen Reactions   Erythromycin     Other reaction(s): Unknown   Azithromycin Other (See Comments)    n/a   Ceclor [Cefaclor] Other (See Comments)    n/a   Penicillins Hives    N/a childhood     Labs: Lab Results  Component Value Date   HIV1RNAQUANT Not Detected 11/21/2023   HIV1RNAQUANT Not Detected 05/23/2023   HIV1RNAQUANT Not Detected 03/27/2023   CD4TABS 732 11/21/2023   CD4TABS 815 05/23/2023   CD4TABS 864 11/27/2022    RPR and STI Lab Results  Component Value Date   LABRPR NON-REACTIVE 03/27/2024   LABRPR NON-REACTIVE 11/21/2023   LABRPR NON-REACTIVE 11/27/2022   LABRPR REACTIVE (A) 09/21/2021   LABRPR REACTIVE (A) 09/22/2020   RPRTITER 1:2 (H) 09/21/2021   RPRTITER 1:2 (H) 09/22/2020   RPRTITER 1:16 (H) 06/09/2020   RPRTITER 1:1 (H) 07/20/2019    STI Results GC CT  11/21/2023 11:26 AM Negative  Negative   05/23/2023 11:04 AM Negative  Negative   11/27/2022  4:33 PM Negative    Negative    Negative  Negative    Negative    Negative   06/09/2020  4:03 PM Negative  Negative   07/20/2019 11:41 AM Negative  Negative   03/21/2017 12:00 AM Negative   Negative     Hepatitis B Lab Results  Component Value Date   HEPBSAB REACTIVE (A) 09/22/2020   HEPBSAG NON-REACTIVE 09/22/2020   HEPBCAB REACTIVE (A) 09/22/2020   Hepatitis C No results found for: HEPCAB, HCVRNAPCRQN Hepatitis A Lab Results  Component Value Date   HAV REACTIVE (A) 01/24/2023   Lipids: Lab Results  Component Value Date   CHOL 181 11/21/2023   TRIG 260 (H) 11/21/2023   HDL 30 (L) 11/21/2023   CHOLHDL 6.0 (H) 11/21/2023   VLDL 34 (H) 03/21/2017   LDLCALC 114 (H) 11/21/2023    TARGET DATE: The 9th  Assessment: Jesse Smith presents today for his maintenance Cabenuva  injections. Past injections were tolerated well without issues. Last HIV RNA on 11/21/23, non-detectable. Will obtain updated labs today. Last STI testing on 03/27/24, negative. He is amenable for updated screening today. Due for flu, Tdap, Shingrix, and PCV20 vaccines. He will defer until next visit.  He is moving to [place] in November or December, and was wondering how he would transition care. Encouraged him to find a doctor he would like to transfer to, then he would have to sign a medical release form in order for our records to be transferred. He is currently in the process of looking for a new doctor and will let us  know when he finds one.   Administered cabotegravir  600mg /23mL in left  upper outer quadrant of the gluteal muscle. Administered rilpivirine  900 mg/3mL in the right upper outer quadrant of the gluteal muscle. No issues with injections. He will follow up in 2 months for next set of injections.   Plan: Cabenuva  injections administered HIV RNA today STI screening ordered for (oral, urine) for gonorrhea and chlamydia, as well as RPR  Next injections scheduled for 07/20/24 and 09/21/24 with Dr. Overton Call with any issues or questions  Woodie Jock, PharmD PGY1 Pharmacy Resident  06/01/2024

## 2024-06-01 ENCOUNTER — Ambulatory Visit (INDEPENDENT_AMBULATORY_CARE_PROVIDER_SITE_OTHER): Admitting: Pharmacist

## 2024-06-01 ENCOUNTER — Other Ambulatory Visit: Payer: Self-pay

## 2024-06-01 DIAGNOSIS — Z113 Encounter for screening for infections with a predominantly sexual mode of transmission: Secondary | ICD-10-CM

## 2024-06-01 DIAGNOSIS — Z21 Asymptomatic human immunodeficiency virus [HIV] infection status: Secondary | ICD-10-CM

## 2024-06-01 DIAGNOSIS — B20 Human immunodeficiency virus [HIV] disease: Secondary | ICD-10-CM

## 2024-06-01 MED ORDER — CABOTEGRAVIR & RILPIVIRINE ER 600 & 900 MG/3ML IM SUER
1.0000 | Freq: Once | INTRAMUSCULAR | Status: AC
  Administered 2024-06-01: 1 via INTRAMUSCULAR

## 2024-06-02 LAB — C. TRACHOMATIS/N. GONORRHOEAE RNA
C. trachomatis RNA, TMA: NOT DETECTED
N. gonorrhoeae RNA, TMA: NOT DETECTED

## 2024-06-02 LAB — GC/CHLAMYDIA PROBE, AMP (THROAT)
Chlamydia trachomatis RNA: NOT DETECTED
Neisseria gonorrhoeae RNA: NOT DETECTED

## 2024-06-04 LAB — HIV-1 RNA QUANT-NO REFLEX-BLD
HIV 1 RNA Quant: NOT DETECTED {copies}/mL
HIV-1 RNA Quant, Log: NOT DETECTED {Log_copies}/mL

## 2024-06-04 LAB — RPR: RPR Ser Ql: NONREACTIVE

## 2024-07-10 ENCOUNTER — Other Ambulatory Visit (HOSPITAL_COMMUNITY): Payer: Self-pay

## 2024-07-10 ENCOUNTER — Other Ambulatory Visit: Payer: Self-pay

## 2024-07-10 NOTE — Progress Notes (Signed)
 Specialty Pharmacy Refill Coordination Note  Jesse Smith is a 33 y.o. male assessed today regarding refills of clinic administered specialty medication(s) Cabotegravir  & Rilpivirine  (CABENUVA )   Clinic requested Courier to Provider Office   Delivery date: 07/15/24   Verified address: [address] [place] [address]   Medication will be filled on 07/14/24.

## 2024-07-15 ENCOUNTER — Telehealth: Payer: Self-pay

## 2024-07-15 NOTE — Telephone Encounter (Signed)
 [facility] Patient Advocate Encounter  Patient's medications CABENUVA have been couriered to [facility] from [facility] and will be administered at the patients appointment on 07/20/24.  Charmaine Sharps, CPhT Specialty Pharmacy Patient [facility] for Infectious Disease Phone: [phone] Fax:  [phone]

## 2024-07-19 NOTE — Progress Notes (Unsigned)
 HPI: Jesse Smith is a 33 y.o. male who presents to the [facility] pharmacy clinic for Cabenuva  administration.  Referring ID Provider: Dr. Overton  Patient Active Problem List   Diagnosis Date Noted   Overweight (BMI 25.0-29.9) 11/21/2023   Need for prophylactic vaccination and inoculation against influenza 05/23/2023   Pulmonary nodule 06/15/2020   Screening examination for venereal disease 03/31/2018   Encounter for long-term (current) use of high-risk medication 03/31/2018   HIV disease (HCC) 03/21/2017    Patient's Medications  New Prescriptions   No medications on file  Previous Medications   CABOTEGRAVIR  & RILPIVIRINE  ER (CABENUVA ) 600 & 900 MG/3ML INJECTION    Inject 1 kit into the muscle every 2 (two) months.  Modified Medications   No medications on file  Discontinued Medications   No medications on file    Allergies: Allergies  Allergen Reactions   Erythromycin     Other reaction(s): Unknown   Azithromycin Other (See Comments)    n/a   Ceclor [Cefaclor] Other (See Comments)    n/a   Penicillins Hives    N/a childhood     Labs: Lab Results  Component Value Date   HIV1RNAQUANT NOT DETECTED 06/01/2024   HIV1RNAQUANT Not Detected 11/21/2023   HIV1RNAQUANT Not Detected 05/23/2023   CD4TABS 732 11/21/2023   CD4TABS 815 05/23/2023   CD4TABS 864 11/27/2022    RPR and STI Lab Results  Component Value Date   LABRPR NON-REACTIVE 06/01/2024   LABRPR NON-REACTIVE 03/27/2024   LABRPR NON-REACTIVE 11/21/2023   LABRPR NON-REACTIVE 11/27/2022   LABRPR REACTIVE (A) 09/21/2021   RPRTITER 1:2 (H) 09/21/2021   RPRTITER 1:2 (H) 09/22/2020   RPRTITER 1:16 (H) 06/09/2020   RPRTITER 1:1 (H) 07/20/2019    STI Results GC CT  11/21/2023 11:26 AM Negative  Negative   05/23/2023 11:04 AM Negative  Negative   11/27/2022  4:33 PM Negative    Negative    Negative  Negative    Negative    Negative   06/09/2020  4:03 PM Negative  Negative   07/20/2019 11:41 AM Negative   Negative   03/21/2017 12:00 AM Negative  Negative     Hepatitis B Lab Results  Component Value Date   HEPBSAB REACTIVE (A) 09/22/2020   HEPBSAG NON-REACTIVE 09/22/2020   HEPBCAB REACTIVE (A) 09/22/2020   Hepatitis C No results found for: HEPCAB, HCVRNAPCRQN Hepatitis A Lab Results  Component Value Date   HAV REACTIVE (A) 01/24/2023   Lipids: Lab Results  Component Value Date   CHOL 181 11/21/2023   TRIG 260 (H) 11/21/2023   HDL 30 (L) 11/21/2023   CHOLHDL 6.0 (H) 11/21/2023   VLDL 34 (H) 03/21/2017   LDLCALC 114 (H) 11/21/2023    Target Date: 9th of the month   Assessment: Kazi presents today for his maintenance Cabenuva  injections. Past injections were tolerated well without issues. Last HIV RNA was undetectable on 06/01/2024. Doing well with no issues today.  How are you doing? Tolerating Cab injections?  Any new partners/STI exposures? STI testing?  Any luck on finding new doctor in [place]? Moving soon?  Vaccines? Want to schedule next appts?  Lab work:  Urine/oropharyngeal/rectal cytologies for [employer], RPR ***  Eligible vaccinations:  Influenza, Covid, PCV20, Tdap, Shingrix ***  Cabenuva : Administered cabotegravir  600mg /27mL in left upper outer quadrant of the gluteal muscle. Administered rilpivirine  900 mg/3mL in the right upper outer quadrant of the gluteal muscle. No issues with injections. *** will follow up in 2 months for  next set of injections.  Plan: - Cabenuva  injections administered - Next injections scheduled for 09/21/2024, then 3/2-3/16*** - Call with any issues or questions  [name], PharmD PGY2 Infectious Diseases Pharmacy Resident

## 2024-07-20 ENCOUNTER — Ambulatory Visit: Admitting: Pharmacist

## 2024-07-20 DIAGNOSIS — Z113 Encounter for screening for infections with a predominantly sexual mode of transmission: Secondary | ICD-10-CM

## 2024-07-21 NOTE — Progress Notes (Unsigned)
 HPI: Jesse Smith is a 33 y.o. male who presents to the [facility] pharmacy clinic for Cabenuva  administration.  Referring ID Provider: Dr. Overton  Patient Active Problem List   Diagnosis Date Noted   Overweight (BMI 25.0-29.9) 11/21/2023   Need for prophylactic vaccination and inoculation against influenza 05/23/2023   Pulmonary nodule 06/15/2020   Screening examination for venereal disease 03/31/2018   Encounter for long-term (current) use of high-risk medication 03/31/2018   HIV disease (HCC) 03/21/2017    Patient's Medications  New Prescriptions   No medications on file  Previous Medications   CABOTEGRAVIR  & RILPIVIRINE  ER (CABENUVA ) 600 & 900 MG/3ML INJECTION    Inject 1 kit into the muscle every 2 (two) months.  Modified Medications   No medications on file  Discontinued Medications   No medications on file    Allergies: Allergies  Allergen Reactions   Erythromycin     Other reaction(s): Unknown   Azithromycin Other (See Comments)    n/a   Ceclor [Cefaclor] Other (See Comments)    n/a   Penicillins Hives    N/a childhood     Labs: Lab Results  Component Value Date   HIV1RNAQUANT NOT DETECTED 06/01/2024   HIV1RNAQUANT Not Detected 11/21/2023   HIV1RNAQUANT Not Detected 05/23/2023   CD4TABS 732 11/21/2023   CD4TABS 815 05/23/2023   CD4TABS 864 11/27/2022    RPR and STI Lab Results  Component Value Date   LABRPR NON-REACTIVE 06/01/2024   LABRPR NON-REACTIVE 03/27/2024   LABRPR NON-REACTIVE 11/21/2023   LABRPR NON-REACTIVE 11/27/2022   LABRPR REACTIVE (A) 09/21/2021   RPRTITER 1:2 (H) 09/21/2021   RPRTITER 1:2 (H) 09/22/2020   RPRTITER 1:16 (H) 06/09/2020   RPRTITER 1:1 (H) 07/20/2019    STI Results GC CT  11/21/2023 11:26 AM Negative  Negative   05/23/2023 11:04 AM Negative  Negative   11/27/2022  4:33 PM Negative    Negative    Negative  Negative    Negative    Negative   06/09/2020  4:03 PM Negative  Negative   07/20/2019 11:41 AM Negative   Negative   03/21/2017 12:00 AM Negative  Negative     Hepatitis B Lab Results  Component Value Date   HEPBSAB REACTIVE (A) 09/22/2020   HEPBSAG NON-REACTIVE 09/22/2020   HEPBCAB REACTIVE (A) 09/22/2020   Hepatitis C No results found for: HEPCAB, HCVRNAPCRQN Hepatitis A Lab Results  Component Value Date   HAV REACTIVE (A) 01/24/2023   Lipids: Lab Results  Component Value Date   CHOL 181 11/21/2023   TRIG 260 (H) 11/21/2023   HDL 30 (L) 11/21/2023   CHOLHDL 6.0 (H) 11/21/2023   VLDL 34 (H) 03/21/2017   LDLCALC 114 (H) 11/21/2023    Target Date: The 9th  Assessment: Jesse Smith presents today for his maintenance Cabenuva  injections. Past injections were tolerated well without issues. Last HIV RNA was not detected in September. Doing well with no issues today. He previously stated that he was going to be moving to [place] this month but will now be pushing off the move until probably August 2026.   Lab work:  Only requesting oral screening today  Eligible vaccinations:  Annual flu vaccine; also due for Tdap, Shingrix, and PCV20. Declines these today.  Cabenuva : Administered cabotegravir  600mg /3mL in left upper outer quadrant of the gluteal muscle. Administered rilpivirine  900 mg/3mL in the right upper outer quadrant of the gluteal muscle. No issues with injections. He will follow up in 2 months for next set  of injections.  Plan: - Cabenuva  injections administered - Oral cytology today - Next injections scheduled for 09/21/24 with Dr. Overton and 11/17/24 with me - Call with any issues or questions  Becky Berberian L. Tobiah Celestine, PharmD, BCIDP, AAHIVP, CPP Clinical Pharmacist Practitioner - Infectious Diseases Clinical Pharmacist Lead - Specialty Pharmacy [facility] for Infectious Disease

## 2024-07-22 ENCOUNTER — Other Ambulatory Visit (HOSPITAL_COMMUNITY)
Admission: RE | Admit: 2024-07-22 | Discharge: 2024-07-22 | Disposition: A | Discharge status: Home / Self Care | Source: Ambulatory Visit | Attending: Internal Medicine | Admitting: Internal Medicine

## 2024-07-22 ENCOUNTER — Ambulatory Visit (INDEPENDENT_AMBULATORY_CARE_PROVIDER_SITE_OTHER): Admitting: Pharmacist

## 2024-07-22 ENCOUNTER — Other Ambulatory Visit: Payer: Self-pay

## 2024-07-22 DIAGNOSIS — B20 Human immunodeficiency virus [HIV] disease: Secondary | ICD-10-CM | POA: Diagnosis not present

## 2024-07-22 DIAGNOSIS — Z113 Encounter for screening for infections with a predominantly sexual mode of transmission: Secondary | ICD-10-CM | POA: Diagnosis present

## 2024-07-22 MED ORDER — CABOTEGRAVIR & RILPIVIRINE ER 600 & 900 MG/3ML IM SUER
1.0000 | Freq: Once | INTRAMUSCULAR | Status: AC
  Administered 2024-07-22: 1 via INTRAMUSCULAR

## 2024-07-23 LAB — CYTOLOGY, (ORAL, ANAL, URETHRAL) ANCILLARY ONLY
Chlamydia: NEGATIVE
Comment: NEGATIVE
Comment: NORMAL
Neisseria Gonorrhea: NEGATIVE

## 2024-07-29 ENCOUNTER — Other Ambulatory Visit: Payer: Self-pay

## 2024-07-29 DIAGNOSIS — Z79899 Other long term (current) drug therapy: Secondary | ICD-10-CM

## 2024-08-24 ENCOUNTER — Other Ambulatory Visit (HOSPITAL_COMMUNITY): Payer: Self-pay

## 2024-08-24 ENCOUNTER — Other Ambulatory Visit: Payer: Self-pay

## 2024-08-24 NOTE — Progress Notes (Signed)
 Specialty Pharmacy Refill Coordination Note  Jesse Smith is a 33 y.o. male assessed today regarding refills of clinic administered specialty medication(s) Cabotegravir  & Rilpivirine  (CABENUVA )   Clinic requested Courier to Provider Office   Delivery date: 09/15/24   Verified address: [address] [place] [address]   Medication will be filled on 09/14/24.

## 2024-09-15 ENCOUNTER — Telehealth: Payer: Self-pay

## 2024-09-15 NOTE — Telephone Encounter (Signed)
 [facility] Patient Advocate Encounter  Patient's medications CABENUVA  have been couriered to [facility] from [facility] and will be administered at the patients appointment on 09/22/24.  Charmaine Sharps, CPhT Specialty Pharmacy Patient [facility] for Infectious Disease Phone: [phone] Fax:  [phone]

## 2024-09-21 ENCOUNTER — Ambulatory Visit: Admitting: Internal Medicine

## 2024-09-21 NOTE — Progress Notes (Unsigned)
 "  HPI: Jesse Smith is a 34 y.o. male who presents to the [facility] pharmacy clinic for Cabenuva  administration.  Referring ID Provider: Dr. Overton  Patient Active Problem List   Diagnosis Date Noted   Overweight (BMI 25.0-29.9) 11/21/2023   Need for prophylactic vaccination and inoculation against influenza 05/23/2023   Pulmonary nodule 06/15/2020   Screening examination for venereal disease 03/31/2018   Encounter for long-term (current) use of high-risk medication 03/31/2018   HIV disease (HCC) 03/21/2017    Patient's Medications  New Prescriptions   No medications on file  Previous Medications   CABOTEGRAVIR  & RILPIVIRINE  ER (CABENUVA ) 600 & 900 MG/3ML INJECTION    Inject 1 kit into the muscle every 2 (two) months.  Modified Medications   No medications on file  Discontinued Medications   No medications on file    Allergies: Allergies[1]  Labs: Lab Results  Component Value Date   HIV1RNAQUANT NOT DETECTED 06/01/2024   HIV1RNAQUANT Not Detected 11/21/2023   HIV1RNAQUANT Not Detected 05/23/2023   CD4TABS 732 11/21/2023   CD4TABS 815 05/23/2023   CD4TABS 864 11/27/2022    RPR and STI Lab Results  Component Value Date   LABRPR NON-REACTIVE 06/01/2024   LABRPR NON-REACTIVE 03/27/2024   LABRPR NON-REACTIVE 11/21/2023   LABRPR NON-REACTIVE 11/27/2022   LABRPR REACTIVE (A) 09/21/2021   RPRTITER 1:2 (H) 09/21/2021   RPRTITER 1:2 (H) 09/22/2020   RPRTITER 1:16 (H) 06/09/2020   RPRTITER 1:1 (H) 07/20/2019    STI Results GC CT  07/22/2024  1:21 PM Negative  Negative   11/21/2023 11:26 AM Negative  Negative   05/23/2023 11:04 AM Negative  Negative   11/27/2022  4:33 PM Negative    Negative    Negative  Negative    Negative    Negative   06/09/2020  4:03 PM Negative  Negative   07/20/2019 11:41 AM Negative  Negative   03/21/2017 12:00 AM Negative  Negative     Hepatitis B Lab Results  Component Value Date   HEPBSAB REACTIVE (A) 09/22/2020   HEPBSAG NON-REACTIVE  09/22/2020   HEPBCAB REACTIVE (A) 09/22/2020   Hepatitis C No results found for: HEPCAB, HCVRNAPCRQN Hepatitis A Lab Results  Component Value Date   HAV REACTIVE (A) 01/24/2023   Lipids: Lab Results  Component Value Date   CHOL 181 11/21/2023   TRIG 260 (H) 11/21/2023   HDL 30 (L) 11/21/2023   CHOLHDL 6.0 (H) 11/21/2023   VLDL 34 (H) 03/21/2017   LDLCALC 114 (H) 11/21/2023    Target Date: 9th  Assessment: Jesse Smith presents today for his maintenance Cabenuva  injections. Past injections were tolerated well without issues. Last HIV RNA was undetectable on 06/01/2024. Doing well with no issues today.  Lab work:  - Defer HIV RNA, lipid panel until next office visit in March  - Patient politely declines STI testing today  Eligible vaccinations: Influenza, Covid, Tdap, PCV20, Shingrix 1/2 (can provide paper prescription)  - Patient politely declines vaccines today   Cabenuva : Administered cabotegravir  600mg /3mL in left upper outer quadrant of the gluteal muscle. Administered rilpivirine  900 mg/3mL in the right upper outer quadrant of the gluteal muscle. No issues with injections. Jesse Smith will follow up in 2 months for next set of injections.  Plan: - Cabenuva  injections administered - Next injections scheduled for 11/17/2024 with Dr. Overton, then 01/18/2025 with Cassie - Call with any issues or questions  Jesse Smith, PharmD PGY2 Infectious Diseases Pharmacy Resident      [1]  Allergies Allergen  Reactions   Erythromycin     Other reaction(s): Unknown   Azithromycin Other (See Comments)    n/a   Ceclor [Cefaclor] Other (See Comments)    n/a   Penicillins Hives    N/a childhood    "

## 2024-09-22 ENCOUNTER — Ambulatory Visit (INDEPENDENT_AMBULATORY_CARE_PROVIDER_SITE_OTHER): Admitting: Pharmacist

## 2024-09-22 ENCOUNTER — Other Ambulatory Visit: Payer: Self-pay

## 2024-09-22 DIAGNOSIS — B20 Human immunodeficiency virus [HIV] disease: Secondary | ICD-10-CM | POA: Diagnosis not present

## 2024-09-22 DIAGNOSIS — Z113 Encounter for screening for infections with a predominantly sexual mode of transmission: Secondary | ICD-10-CM | POA: Diagnosis not present

## 2024-09-22 MED ORDER — CABOTEGRAVIR & RILPIVIRINE ER 600 & 900 MG/3ML IM SUER
1.0000 | Freq: Once | INTRAMUSCULAR | Status: AC
  Administered 2024-09-22: 1 via INTRAMUSCULAR

## 2024-09-23 NOTE — Progress Notes (Signed)
 SABRA

## 2024-10-06 ENCOUNTER — Telehealth: Payer: Self-pay

## 2024-10-06 NOTE — Telephone Encounter (Addendum)
 Pharmacy Patient Advocate Encounter- Cabenuva  BIV-Pharmacy Benefit:  Please send prescription to Specialty Pharmacy: [facility]: [phone]  Estimated Copay is: 0.00  Submitted a Prior Authorization request to [employer] for Cabenuva  via CoverMyMeds. Will update once we receive a response.  Pharmacy Benefits  PA ID: [id number]

## 2024-10-07 ENCOUNTER — Telehealth: Payer: Self-pay

## 2024-10-07 NOTE — Telephone Encounter (Signed)
 Medical? Pharmacy?

## 2024-10-07 NOTE — Telephone Encounter (Signed)
 SABRA

## 2024-10-12 NOTE — Telephone Encounter (Signed)
 Can you please update this whenever you get a chance? Thank you.

## 2024-11-17 ENCOUNTER — Ambulatory Visit: Admitting: Pharmacist

## 2024-11-17 ENCOUNTER — Ambulatory Visit: Payer: Self-pay | Admitting: Internal Medicine

## 2025-01-18 ENCOUNTER — Ambulatory Visit: Admitting: Pharmacist
